# Patient Record
Sex: Male | Born: 1945 | Race: Black or African American | Hispanic: No | Marital: Single | Smoking: Former smoker
Health system: Southern US, Community
[De-identification: ages and names within clinical notes are randomized; demographics above are authoritative.]

## PROBLEM LIST (undated history)

## (undated) DIAGNOSIS — F0390 Unspecified dementia without behavioral disturbance: Secondary | ICD-10-CM

## (undated) DIAGNOSIS — I10 Essential (primary) hypertension: Secondary | ICD-10-CM

## (undated) DIAGNOSIS — E875 Hyperkalemia: Secondary | ICD-10-CM

---

## 2001-10-19 ENCOUNTER — Inpatient Hospital Stay (HOSPITAL_COMMUNITY): Admission: EM | Admit: 2001-10-19 | Discharge: 2001-10-22 | Payer: Self-pay

## 2001-10-19 ENCOUNTER — Encounter: Payer: Self-pay | Admitting: General Surgery

## 2001-10-20 ENCOUNTER — Encounter: Payer: Self-pay | Admitting: General Surgery

## 2005-12-04 ENCOUNTER — Emergency Department (HOSPITAL_COMMUNITY): Admission: EM | Admit: 2005-12-04 | Discharge: 2005-12-04 | Payer: Self-pay | Admitting: Emergency Medicine

## 2007-05-09 ENCOUNTER — Emergency Department (HOSPITAL_COMMUNITY): Admission: EM | Admit: 2007-05-09 | Discharge: 2007-05-09 | Payer: Self-pay | Admitting: Emergency Medicine

## 2007-08-17 ENCOUNTER — Emergency Department (HOSPITAL_COMMUNITY): Admission: EM | Admit: 2007-08-17 | Discharge: 2007-08-17 | Payer: Self-pay | Admitting: Emergency Medicine

## 2007-08-22 ENCOUNTER — Emergency Department (HOSPITAL_COMMUNITY): Admission: EM | Admit: 2007-08-22 | Discharge: 2007-08-22 | Payer: Self-pay | Admitting: Emergency Medicine

## 2008-05-13 ENCOUNTER — Emergency Department (HOSPITAL_BASED_OUTPATIENT_CLINIC_OR_DEPARTMENT_OTHER): Admission: EM | Admit: 2008-05-13 | Discharge: 2008-05-13 | Payer: Self-pay | Admitting: Emergency Medicine

## 2008-05-22 ENCOUNTER — Emergency Department (HOSPITAL_COMMUNITY): Admission: EM | Admit: 2008-05-22 | Discharge: 2008-05-23 | Payer: Self-pay | Admitting: Emergency Medicine

## 2008-06-08 ENCOUNTER — Emergency Department (HOSPITAL_COMMUNITY): Admission: EM | Admit: 2008-06-08 | Discharge: 2008-06-08 | Payer: Self-pay | Admitting: Emergency Medicine

## 2008-06-10 ENCOUNTER — Emergency Department (HOSPITAL_COMMUNITY): Admission: EM | Admit: 2008-06-10 | Discharge: 2008-06-10 | Payer: Self-pay | Admitting: Emergency Medicine

## 2008-12-08 ENCOUNTER — Ambulatory Visit: Payer: Self-pay | Admitting: Cardiology

## 2008-12-09 ENCOUNTER — Inpatient Hospital Stay (HOSPITAL_COMMUNITY): Admission: EM | Admit: 2008-12-09 | Discharge: 2008-12-10 | Payer: Self-pay | Admitting: Emergency Medicine

## 2008-12-10 ENCOUNTER — Encounter (INDEPENDENT_AMBULATORY_CARE_PROVIDER_SITE_OTHER): Payer: Self-pay | Admitting: Internal Medicine

## 2009-01-28 ENCOUNTER — Emergency Department (HOSPITAL_COMMUNITY): Admission: EM | Admit: 2009-01-28 | Discharge: 2009-01-28 | Payer: Self-pay | Admitting: Emergency Medicine

## 2009-01-30 ENCOUNTER — Emergency Department (HOSPITAL_COMMUNITY): Admission: EM | Admit: 2009-01-30 | Discharge: 2009-01-30 | Payer: Self-pay | Admitting: Emergency Medicine

## 2009-04-15 ENCOUNTER — Ambulatory Visit: Payer: Self-pay | Admitting: Cardiovascular Disease

## 2009-04-15 ENCOUNTER — Inpatient Hospital Stay (HOSPITAL_COMMUNITY): Admission: EM | Admit: 2009-04-15 | Discharge: 2009-04-30 | Payer: Self-pay | Admitting: Emergency Medicine

## 2009-04-16 ENCOUNTER — Encounter (INDEPENDENT_AMBULATORY_CARE_PROVIDER_SITE_OTHER): Payer: Self-pay | Admitting: Internal Medicine

## 2009-04-16 ENCOUNTER — Ambulatory Visit: Payer: Self-pay | Admitting: Surgery

## 2009-04-17 ENCOUNTER — Encounter (INDEPENDENT_AMBULATORY_CARE_PROVIDER_SITE_OTHER): Payer: Self-pay | Admitting: Internal Medicine

## 2010-03-21 LAB — POCT I-STAT, CHEM 8
BUN: 21 mg/dL (ref 6–23)
Calcium, Ion: 1.15 mmol/L (ref 1.12–1.32)
Creatinine, Ser: 1.2 mg/dL (ref 0.4–1.5)
Potassium: 3.9 mEq/L (ref 3.5–5.1)

## 2010-03-21 LAB — URINALYSIS, ROUTINE W REFLEX MICROSCOPIC
Hgb urine dipstick: NEGATIVE
Nitrite: NEGATIVE
Protein, ur: NEGATIVE mg/dL
Specific Gravity, Urine: 1.014 (ref 1.005–1.030)
pH: 6 (ref 5.0–8.0)

## 2010-03-23 LAB — GLUCOSE, CAPILLARY
Glucose-Capillary: 109 mg/dL — ABNORMAL HIGH (ref 70–99)
Glucose-Capillary: 114 mg/dL — ABNORMAL HIGH (ref 70–99)
Glucose-Capillary: 71 mg/dL (ref 70–99)

## 2010-03-23 LAB — BASIC METABOLIC PANEL
BUN: 14 mg/dL (ref 6–23)
BUN: 28 mg/dL — ABNORMAL HIGH (ref 6–23)
BUN: 32 mg/dL — ABNORMAL HIGH (ref 6–23)
CO2: 24 mEq/L (ref 19–32)
CO2: 25 mEq/L (ref 19–32)
CO2: 27 mEq/L (ref 19–32)
CO2: 28 mEq/L (ref 19–32)
CO2: 29 mEq/L (ref 19–32)
Calcium: 8.5 mg/dL (ref 8.4–10.5)
Calcium: 9.1 mg/dL (ref 8.4–10.5)
Calcium: 9.2 mg/dL (ref 8.4–10.5)
Calcium: 9.5 mg/dL (ref 8.4–10.5)
Calcium: 9.5 mg/dL (ref 8.4–10.5)
Chloride: 102 mEq/L (ref 96–112)
Chloride: 103 mEq/L (ref 96–112)
Chloride: 103 mEq/L (ref 96–112)
Chloride: 103 mEq/L (ref 96–112)
Chloride: 109 mEq/L (ref 96–112)
Creatinine, Ser: 1 mg/dL (ref 0.4–1.5)
Creatinine, Ser: 1 mg/dL (ref 0.4–1.5)
Creatinine, Ser: 1.19 mg/dL (ref 0.4–1.5)
Creatinine, Ser: 1.23 mg/dL (ref 0.4–1.5)
GFR calc Af Amer: >60 mL/min (ref >60)
GFR calc Af Amer: >60 mL/min (ref >60)
GFR calc Af Amer: >60 mL/min (ref >60)
GFR calc Af Amer: >60 mL/min (ref >60)
GFR calc Af Amer: >60 mL/min (ref >60)
GFR calc non Af Amer: >60 mL/min (ref >60)
GFR calc non Af Amer: >60 mL/min (ref >60)
Glucose, Bld: 66 mg/dL — ABNORMAL LOW (ref 70–99)
Glucose, Bld: 74 mg/dL (ref 70–99)
Glucose, Bld: 79 mg/dL (ref 70–99)
Glucose, Bld: 83 mg/dL (ref 70–99)
Potassium: 5.2 mEq/L — ABNORMAL HIGH (ref 3.5–5.1)
Potassium: 5.3 mEq/L — ABNORMAL HIGH (ref 3.5–5.1)
Potassium: 5.6 mEq/L — ABNORMAL HIGH (ref 3.5–5.1)
Sodium: 135 mEq/L (ref 135–145)
Sodium: 136 mEq/L (ref 135–145)
Sodium: 138 mEq/L (ref 135–145)

## 2010-03-23 LAB — CBC
HCT: 45.7 % (ref 39.0–52.0)
Hemoglobin: 15.3 g/dL (ref 13.0–17.0)
MCV: 86.2 fL (ref 78.0–100.0)
Platelets: 282 10*3/uL (ref 150–400)
Platelets: 295 10*3/uL (ref 150–400)
RBC: 4.1 MIL/uL — ABNORMAL LOW (ref 4.22–5.81)
RDW: 14.4 % (ref 11.5–15.5)
WBC: 6 10*3/uL (ref 4.0–10.5)
WBC: 7.2 10*3/uL (ref 4.0–10.5)

## 2010-03-23 LAB — PSA: PSA: 0.68 ng/mL (ref 0.10–4.00)

## 2010-03-23 LAB — VITAMIN B1: Vitamin B1 (Thiamine): 10 nmol/L (ref 9–44)

## 2010-03-23 LAB — VITAMIN B12: Vitamin B-12: 265 pg/mL (ref 211–911)

## 2010-03-24 LAB — AMMONIA: Ammonia: 43 umol/L — ABNORMAL HIGH (ref 11–35)

## 2010-03-24 LAB — RETICULOCYTES
RBC.: 4.72 MIL/uL (ref 4.22–5.81)
Retic Ct Pct: 0.6 % (ref 0.4–3.1)

## 2010-03-24 LAB — DIFFERENTIAL
Basophils Absolute: 0 10*3/uL (ref 0.0–0.1)
Basophils Absolute: 0.1 10*3/uL (ref 0.0–0.1)
Basophils Relative: 0 % (ref 0–1)
Eosinophils Relative: 2 % (ref 0–5)
Eosinophils Relative: 4 % (ref 0–5)
Lymphocytes Relative: 14 % (ref 12–46)
Lymphocytes Relative: 18 % (ref 12–46)
Lymphs Abs: 1.1 10*3/uL (ref 0.7–4.0)
Monocytes Absolute: 0.6 10*3/uL (ref 0.1–1.0)
Neutro Abs: 4.2 10*3/uL (ref 1.7–7.7)

## 2010-03-24 LAB — IRON AND TIBC
Iron: 77 ug/dL (ref 42–135)
Saturation Ratios: 32 % (ref 20–55)
UIBC: 164 ug/dL

## 2010-03-24 LAB — CBC
HCT: 36.5 % — ABNORMAL LOW (ref 39.0–52.0)
HCT: 39.5 % (ref 39.0–52.0)
MCHC: 33.2 g/dL (ref 30.0–36.0)
MCV: 84.9 fL (ref 78.0–100.0)
MCV: 86.2 fL (ref 78.0–100.0)
Platelets: 300 10*3/uL (ref 150–400)
RBC: 4.29 MIL/uL (ref 4.22–5.81)
WBC: 7.8 10*3/uL (ref 4.0–10.5)

## 2010-03-24 LAB — COMPREHENSIVE METABOLIC PANEL
AST: 28 U/L (ref 0–37)
AST: 28 U/L (ref 0–37)
Albumin: 3.5 g/dL (ref 3.5–5.2)
BUN: 18 mg/dL (ref 6–23)
BUN: 25 mg/dL — ABNORMAL HIGH (ref 6–23)
CO2: 25 mEq/L (ref 19–32)
Calcium: 8.5 mg/dL (ref 8.4–10.5)
Chloride: 105 mEq/L (ref 96–112)
Creatinine, Ser: 1.18 mg/dL (ref 0.4–1.5)
Creatinine, Ser: 1.39 mg/dL (ref 0.4–1.5)
GFR calc Af Amer: >60 mL/min (ref >60)
GFR calc Af Amer: >60 mL/min (ref >60)
GFR calc non Af Amer: 52 mL/min — ABNORMAL LOW (ref >60)
GFR calc non Af Amer: >60 mL/min (ref >60)
Total Bilirubin: 0.4 mg/dL (ref 0.3–1.2)

## 2010-03-24 LAB — RAPID URINE DRUG SCREEN, HOSP PERFORMED
Amphetamines: NOT DETECTED
Tetrahydrocannabinol: NOT DETECTED

## 2010-03-24 LAB — RPR: RPR Ser Ql: NONREACTIVE

## 2010-03-24 LAB — URINALYSIS, ROUTINE W REFLEX MICROSCOPIC
Hgb urine dipstick: NEGATIVE
Ketones, ur: 15 mg/dL — AB
Protein, ur: NEGATIVE mg/dL
Urobilinogen, UA: 1 mg/dL (ref 0.0–1.0)

## 2010-03-24 LAB — PROTIME-INR: Prothrombin Time: 15.7 seconds — ABNORMAL HIGH (ref 11.6–15.2)

## 2010-03-24 LAB — APTT: aPTT: 35 seconds (ref 24–37)

## 2010-03-24 LAB — FERRITIN: Ferritin: 147 ng/mL (ref 22–322)

## 2010-03-24 LAB — TSH: TSH: 1.213 u[IU]/mL (ref 0.350–4.500)

## 2010-03-24 LAB — CK TOTAL AND CKMB (NOT AT ARMC)
CK, MB: 5.4 ng/mL — ABNORMAL HIGH (ref 0.3–4.0)
Total CK: 465 U/L — ABNORMAL HIGH (ref 7–232)

## 2010-03-24 LAB — LIPID PANEL
HDL: 43 mg/dL (ref >39)
Triglycerides: 23 mg/dL (ref <150)
VLDL: 5 mg/dL (ref 0–40)

## 2010-03-24 LAB — PHOSPHORUS: Phosphorus: 3.2 mg/dL (ref 2.3–4.6)

## 2010-03-24 LAB — HEMOGLOBIN A1C: Mean Plasma Glucose: 123 mg/dL — ABNORMAL HIGH (ref <117)

## 2010-03-24 LAB — TROPONIN I: Troponin I: <0.01 ng/mL (ref 0.00–0.06)

## 2010-04-06 LAB — DIFFERENTIAL
Lymphocytes Relative: 16 % (ref 12–46)
Lymphs Abs: 0.8 10*3/uL (ref 0.7–4.0)
Monocytes Relative: 6 % (ref 3–12)
Neutro Abs: 4 10*3/uL (ref 1.7–7.7)
Neutrophils Relative %: 77 % (ref 43–77)

## 2010-04-06 LAB — CARDIAC PANEL(CRET KIN+CKTOT+MB+TROPI)
CK, MB: 10.9 ng/mL — ABNORMAL HIGH (ref 0.3–4.0)
CK, MB: 11.5 ng/mL — ABNORMAL HIGH (ref 0.3–4.0)
CK, MB: 5.6 ng/mL — ABNORMAL HIGH (ref 0.3–4.0)
CK, MB: 7.9 ng/mL — ABNORMAL HIGH (ref 0.3–4.0)
CK, MB: 8.5 ng/mL — ABNORMAL HIGH (ref 0.3–4.0)
Relative Index: 2.6 — ABNORMAL HIGH (ref 0.0–2.5)
Relative Index: 3.6 — ABNORMAL HIGH (ref 0.0–2.5)
Total CK: 213 U/L (ref 7–232)
Total CK: 217 U/L (ref 7–232)
Total CK: 263 U/L — ABNORMAL HIGH (ref 7–232)
Total CK: 298 U/L — ABNORMAL HIGH (ref 7–232)
Total CK: 371 U/L — ABNORMAL HIGH (ref 7–232)
Troponin I: 0.01 ng/mL (ref 0.00–0.06)
Troponin I: 0.01 ng/mL (ref 0.00–0.06)

## 2010-04-06 LAB — COMPREHENSIVE METABOLIC PANEL
ALT: 13 U/L (ref 0–53)
BUN: 16 mg/dL (ref 6–23)
Calcium: 9 mg/dL (ref 8.4–10.5)
Creatinine, Ser: 0.87 mg/dL (ref 0.4–1.5)
Glucose, Bld: 91 mg/dL (ref 70–99)
Sodium: 136 mEq/L (ref 135–145)
Total Protein: 6.9 g/dL (ref 6.0–8.3)

## 2010-04-06 LAB — BASIC METABOLIC PANEL
CO2: 27 mEq/L (ref 19–32)
Calcium: 9.1 mg/dL (ref 8.4–10.5)
Chloride: 102 mEq/L (ref 96–112)
GFR calc Af Amer: 59 mL/min — ABNORMAL LOW (ref >60)
Glucose, Bld: 88 mg/dL (ref 70–99)
Sodium: 138 mEq/L (ref 135–145)

## 2010-04-06 LAB — URINALYSIS, ROUTINE W REFLEX MICROSCOPIC
Bilirubin Urine: NEGATIVE
Glucose, UA: NEGATIVE mg/dL
Glucose, UA: NEGATIVE mg/dL
Ketones, ur: NEGATIVE mg/dL
Specific Gravity, Urine: 1.021 (ref 1.005–1.030)
Specific Gravity, Urine: 1.022 (ref 1.005–1.030)
Urobilinogen, UA: 1 mg/dL (ref 0.0–1.0)
pH: 6 (ref 5.0–8.0)

## 2010-04-06 LAB — CBC
Hemoglobin: 13.1 g/dL (ref 13.0–17.0)
Hemoglobin: 13.5 g/dL (ref 13.0–17.0)
MCHC: 32.2 g/dL (ref 30.0–36.0)
MCHC: 32.8 g/dL (ref 30.0–36.0)
RBC: 4.74 MIL/uL (ref 4.22–5.81)
RDW: 14.7 % (ref 11.5–15.5)
WBC: 5.2 10*3/uL (ref 4.0–10.5)

## 2010-04-06 LAB — POCT CARDIAC MARKERS
CKMB, poc: 2.4 ng/mL (ref 1.0–8.0)
Myoglobin, poc: 172 ng/mL (ref 12–200)
Troponin i, poc: <0.05 ng/mL (ref 0.00–0.09)

## 2010-04-06 LAB — URINE MICROSCOPIC-ADD ON

## 2010-04-06 LAB — RAPID URINE DRUG SCREEN, HOSP PERFORMED: Barbiturates: NOT DETECTED

## 2010-05-18 NOTE — Consult Note (Signed)
Gentry, Leonard                 ACCOUNT NO.:  [id number]   MEDICAL RECORD NO.:  [id number]          PATIENT TYPE:  EMS   LOCATION:  ED                           FACILITY:  [facility]   PHYSICIAN:  Velora Heckler, MD      DATE OF BIRTH:  1945/02/05   DATE OF CONSULTATION:  08/17/2007  DATE OF DISCHARGE:                                 CONSULTATION   REPORT TITLE:  EMERGENCY ROOM CONSULTATION   REFERRING PHYSICIAN:  Dr. Benjiman Core   CHIEF COMPLAINT:  Abdominal pain.   HISTORY OF PRESENT ILLNESS:  Leonard Gentry is a 65 year old black male who  presented to the emergency department this morning with complaints of  right lower quadrant abdominal pain.  The patient has a known history of  a large right hydrocele.  Workup included CBC with a normal white blood  cell count and normal differential.  CT scan abdomen and pelvis was  obtained which had soft findings of a possible distal small bowel  intussusception.  Also noted was a large right groin and scrotal  hydrocele.  General surgery was asked to evaluate.   The patient noted onset of lower abdominal pain approximately mid  morning.  The patient is now pain free.  There is no nausea or vomiting.  The patient had a normal bowel movement yesterday.   PAST MEDICAL HISTORY:  Hypertension, untreated.   MEDICATIONS:  None.   ALLERGIES:  None known.   SOCIAL HISTORY:  The patient is homeless.  He denies tobacco or alcohol  use.   REVIEW OF SYSTEMS:  Fifteen system review otherwise negative.  The  patient does note hypertension, but states that it comes and goes.   PHYSICAL EXAMINATION:  GENERAL:  A 65 year old black male in no acute  distress on a stretcher in the emergency department.  VITAL SIGNS:  Temperature 98.4, pulse 80, respirations 16, blood pressure 158/111.  HEENT:  Shows him to be normocephalic, atraumatic.  Mucous membranes are  moist.  Sclerae are without signs of icterus.  CHEST:  Clear to auscultation  bilaterally.  CARDIAC:  Shows a regular rate and rhythm.  Peripheral pulses are full.  ABDOMEN:  Soft without distention.  There are active bowel sounds  present.  There is no tenderness.  There is no mass.  There is no  guarding.  There is no rebound tenderness.  There are no obvious  surgical wounds.  GENITOURINARY:  Shows a very large right scrotal hydrocele.  This  extends from the inguinal canal to the base of the scrotum.  It  essentially stretches the foreskin of the penis over the penis.  With  Valsalva and cough, I do not appreciate a hernia in either the right or  left groin.  EXTREMITIES:  Nontender without edema.  NEUROLOGICAL:  The patient is alert and oriented and appropriately  responsive.  There is no sign of tremor.   LABORATORY STUDIES:  White count 7.3, hemoglobin 14.0, platelet count  234,000.  Electrolytes are normal.   CT scan abdomen and pelvis was reviewed with my partner, Dr. Molli Hazard  Tsuei.  While there are some subtle findings in the right lower quadrant  that could possibly represent a small bowel intussusception, it is  certainly not definitive.  It is not obstructive.  There is also noted  the right hydrocele extending into the right inguinal canal.   IMPRESSION:  1. Possible intermittent small bowel intussusception, now resolved.  2. Large right hydrocele.   RECOMMENDATIONS:  1. Referral to [facility] to coordinate patient's care.  2. Consider small bowel follow-through series to evaluate small bowel      for source of intussusception, such as a lipoma or other mass that      could serve as a leading edge  3. Urology referral for hydrocele repair.      Velora Heckler, MD  Electronically Signed     TMG/MEDQ  D:  08/17/2007  T:  08/17/2007  Job:  [id number]   cc:   Billee Cashing, MD   Dineen Kid Reche Dixon, M.D.  Fax: [phone]
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
## 2010-10-01 LAB — DIFFERENTIAL
Basophils Relative: 0
Lymphocytes Relative: 8 — ABNORMAL LOW
Lymphs Abs: 0.6 — ABNORMAL LOW
Monocytes Absolute: 0.2
Monocytes Relative: 3
Neutro Abs: 6.5
Neutrophils Relative %: 89 — ABNORMAL HIGH

## 2010-10-01 LAB — POCT I-STAT, CHEM 8
BUN: 15
Chloride: 103
Glucose, Bld: 87
HCT: 46
Potassium: 4.5

## 2010-10-01 LAB — CBC
HCT: 43.5
Hemoglobin: 14
MCHC: 32.1
MCV: 86.9
RBC: 5.01
WBC: 7.3

## 2011-04-11 ENCOUNTER — Other Ambulatory Visit: Payer: Self-pay | Admitting: Internal Medicine

## 2011-11-11 ENCOUNTER — Emergency Department (HOSPITAL_COMMUNITY): Payer: Medicare Other

## 2011-11-11 ENCOUNTER — Encounter (HOSPITAL_COMMUNITY): Payer: Self-pay | Admitting: Emergency Medicine

## 2011-11-11 ENCOUNTER — Emergency Department (HOSPITAL_COMMUNITY)
Admission: EM | Admit: 2011-11-11 | Discharge: 2011-11-11 | Disposition: A | Discharge status: Home / Self Care | Payer: Medicare Other | Attending: Emergency Medicine | Admitting: Emergency Medicine

## 2011-11-11 DIAGNOSIS — Z79899 Other long term (current) drug therapy: Secondary | ICD-10-CM | POA: Insufficient documentation

## 2011-11-11 DIAGNOSIS — I10 Essential (primary) hypertension: Secondary | ICD-10-CM | POA: Insufficient documentation

## 2011-11-11 DIAGNOSIS — Y939 Activity, unspecified: Secondary | ICD-10-CM | POA: Insufficient documentation

## 2011-11-11 DIAGNOSIS — Y9210 Unspecified residential institution as the place of occurrence of the external cause: Secondary | ICD-10-CM | POA: Insufficient documentation

## 2011-11-11 DIAGNOSIS — S0990XA Unspecified injury of head, initial encounter: Secondary | ICD-10-CM | POA: Insufficient documentation

## 2011-11-11 DIAGNOSIS — W19XXXA Unspecified fall, initial encounter: Secondary | ICD-10-CM

## 2011-11-11 DIAGNOSIS — F0390 Unspecified dementia without behavioral disturbance: Secondary | ICD-10-CM | POA: Insufficient documentation

## 2011-11-11 DIAGNOSIS — W06XXXA Fall from bed, initial encounter: Secondary | ICD-10-CM | POA: Insufficient documentation

## 2011-11-11 DIAGNOSIS — Z7982 Long term (current) use of aspirin: Secondary | ICD-10-CM | POA: Insufficient documentation

## 2011-11-11 HISTORY — DX: Essential (primary) hypertension: I10

## 2011-11-11 HISTORY — DX: Unspecified dementia, unspecified severity, without behavioral disturbance, psychotic disturbance, mood disturbance, and anxiety: F03.90

## 2011-11-11 HISTORY — DX: Hyperkalemia: E87.5

## 2011-11-11 NOTE — ED Notes (Signed)
ZOX:[id number]<AV> Expected date:11/11/11<BR> Expected time: 1:18 AM<BR> Means of arrival:Ambulance<BR> Comments:<BR> fall

## 2011-11-11 NOTE — ED Notes (Signed)
Report given via EMS. Pt c/o lower back pain as a result of unwitnessed fall 0100. No deformity in spine or back of neck noted via EMS. Pt from [facility] on [address]. Pt was found with legs elevated on bed and head on floor. EMS found pt placed back in bed asleep on arrival. Pt coherent upon awakening, denies remember falling "just woke up on the floor." Initial VS 128/82 Pulse 96 RR 12, no SOB pt not placed on monitor at 0115.

## 2011-11-11 NOTE — ED Notes (Signed)
Pt taken to CT.

## 2011-11-11 NOTE — ED Provider Notes (Signed)
History     CSN: [id number]  Arrival date & time 11/11/11  0129   First MD Initiated Contact with Patient 11/11/11 0147      Chief Complaint  Patient presents with  . Fall    (Consider location/radiation/quality/duration/timing/severity/associated sxs/prior treatment) HPI HX per EMS per nursing home, fell tonight at nursing facility, found legs on bed and head on floor, PT does not recall falling , has h/o dementia and reported at baseline. No bleeding or other inury noted, Pt denies any pain, is questionable reliable historian - level 5 caveat applies Past Medical History  Diagnosis Date  . Hypertension   . Hyperkalemia   . Dementia     History reviewed. No pertinent past surgical history.  No family history on file.  History  Substance Use Topics  . Smoking status: Not on file  . Smokeless tobacco: Not on file  . Alcohol Use:       Review of Systems  Unable to perform ROS level 5 caveat as above - unreliable historian with dementia  Allergies  Review of patient's allergies indicates no known allergies.  Home Medications  No current outpatient prescriptions on file.  BP 107/63  Pulse 97  Temp 97.3 F (36.3 C) (Oral)  Resp 16  SpO2 96%  Physical Exam  Constitutional: He appears well-developed and well-nourished.  HENT:  Head: Normocephalic and atraumatic.  Eyes: Conjunctivae normal and EOM are normal. Pupils are equal, round, and reactive to light.  Neck:       No midline deformity or reproducible tenderness  Cardiovascular: Normal rate, regular rhythm and intact distal pulses.   Pulmonary/Chest: Effort normal and breath sounds normal. No respiratory distress.  Musculoskeletal: Normal range of motion. He exhibits no edema.       Mild lumbar tenderness no deformity. No LE deficits  Neurological:       Awake, alert and oriented to self  Skin: Skin is warm and dry.    ED Course  Procedures (including critical care time)   Dg Lumbar Spine  Complete  11/11/2011  *RADIOLOGY REPORT*  Clinical Data: Status post fall; lower back pain.  LUMBAR SPINE - COMPLETE 4+ VIEW  Comparison: CT of the abdomen and pelvis performed 08/17/2007  Findings: There is no evidence of acute fracture or subluxation. There is slight grade 1 anterolisthesis of L4 on L5.  Facet disease is noted along the lumbar spine; mild degenerative change is noted at the lower lumbar spine.  Slight apparent wedging of vertebral body T11 appears to reflect positioning.  The visualized bowel gas pattern is unremarkable in appearance; air and stool are noted within the colon.  The sacroiliac joints are within normal limits.  IMPRESSION:  1.  No evidence of acute fracture or subluxation along the lumbar spine. 2.  Mild degenerative change along the lumbar spine.   Original Report Authenticated By: Tonia Ghent, M.D.    Ct Head Wo Contrast  11/11/2011  *RADIOLOGY REPORT*  Clinical Data:  Unwitnessed fall; found with head on floor. Concern for head or cervical spine injury.  CT HEAD WITHOUT CONTRAST AND CT CERVICAL SPINE WITHOUT CONTRAST  Technique:  Multidetector CT imaging of the head and cervical spine was performed following the standard protocol without intravenous contrast.  Multiplanar CT image reconstructions of the cervical spine were also generated.  Comparison: CT of the head performed 04/15/2009, and MRI/MRA of the brain performed 04/16/2009  CT HEAD  Findings: There is no evidence of acute infarction, mass lesion, or intra-  or extra-axial hemorrhage on CT.  Prominence of the ventricles and sulci reflects moderate cortical volume loss.  Periventricular and subcortical white matter change likely reflects small vessel ischemic microangiopathy.  Mild cerebellar atrophy is noted.  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  The brainstem and fourth ventricle are within normal limits.  The basal ganglia are unremarkable in appearance.  No mass effect or midline  shift is seen.  There is no evidence of fracture; visualized osseous structures are unremarkable in appearance.  The orbits are within normal limits. The paranasal sinuses and mastoid air cells are well-aerated.  A soft tissue laceration is noted overlying the right frontal calvarium.  A large amount of cerumen is noted within the right external auditory canal.  IMPRESSION:  1.  No evidence of traumatic intracranial injury or fracture. 2.  Moderate cortical volume loss and scattered small vessel ischemic microangiopathy; small chronic infarct noted within the inferior left frontal lobe. 3.  Soft tissue laceration overlying the right frontal calvarium. 4.  Large amount of cerumen noted within the right external auditory canal.  CT CERVICAL SPINE  Findings: There is no evidence of fracture or subluxation. Vertebral bodies demonstrate normal height and alignment. Multilevel disc space narrowing is noted along the cervical spine, with associated anterior and posterior disc osteophyte complexes. Prevertebral soft tissues are within normal limits.  The thyroid gland is unremarkable in appearance.  The visualized lung apices are clear.  No significant soft tissue abnormalities are seen.  IMPRESSION:  1.  No evidence of fracture or subluxation along the cervical spine. 2.  Degenerative change noted along the cervical spine.   Original Report Authenticated By: Tonia Ghent, M.D.    Ct Cervical Spine Wo Contrast  11/11/2011  *RADIOLOGY REPORT*  Clinical Data:  Unwitnessed fall; found with head on floor. Concern for head or cervical spine injury.  CT HEAD WITHOUT CONTRAST AND CT CERVICAL SPINE WITHOUT CONTRAST  Technique:  Multidetector CT imaging of the head and cervical spine was performed following the standard protocol without intravenous contrast.  Multiplanar CT image reconstructions of the cervical spine were also generated.  Comparison: CT of the head performed 04/15/2009, and MRI/MRA of the brain performed  04/16/2009  CT HEAD  Findings: There is no evidence of acute infarction, mass lesion, or intra- or extra-axial hemorrhage on CT.  Prominence of the ventricles and sulci reflects moderate cortical volume loss.  Periventricular and subcortical white matter change likely reflects small vessel ischemic microangiopathy.  Mild cerebellar atrophy is noted.  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  The brainstem and fourth ventricle are within normal limits.  The basal ganglia are unremarkable in appearance.  No mass effect or midline shift is seen.  There is no evidence of fracture; visualized osseous structures are unremarkable in appearance.  The orbits are within normal limits. The paranasal sinuses and mastoid air cells are well-aerated.  A soft tissue laceration is noted overlying the right frontal calvarium.  A large amount of cerumen is noted within the right external auditory canal.  IMPRESSION:  1.  No evidence of traumatic intracranial injury or fracture. 2.  Moderate cortical volume loss and scattered small vessel ischemic microangiopathy; small chronic infarct noted within the inferior left frontal lobe. 3.  Soft tissue laceration overlying the right frontal calvarium. 4.  Large amount of cerumen noted within the right external auditory canal.  CT CERVICAL SPINE  Findings: There is no evidence  of fracture or subluxation. Vertebral bodies demonstrate normal height and alignment. Multilevel disc space narrowing is noted along the cervical spine, with associated anterior and posterior disc osteophyte complexes. Prevertebral soft tissues are within normal limits.  The thyroid gland is unremarkable in appearance.  The visualized lung apices are clear.  No significant soft tissue abnormalities are seen.  IMPRESSION:  1.  No evidence of fracture or subluxation along the cervical spine. 2.  Degenerative change noted along the cervical spine.   Original Report Authenticated By: Tonia Ghent,  M.D.     MDM   Fall in nursing home PT with dementia - fell partially out of bed, evaluated with imaging reviewed as above, no evidence fo acute injury, no indication for admit or further work up at this time, reported baseline mentation, stable for d/c back to facility        Sunnie Nielsen, MD 11/11/11 [phone]

## 2011-11-11 NOTE — ED Notes (Signed)
Pt in bed resting with eyes closed and respirations normal on assessment during entire stay.

## 2012-04-06 ENCOUNTER — Encounter: Payer: Self-pay | Admitting: Nurse Practitioner

## 2012-04-06 ENCOUNTER — Non-Acute Institutional Stay (SKILLED_NURSING_FACILITY): Payer: Medicare Other | Admitting: Nurse Practitioner

## 2012-04-06 DIAGNOSIS — H409 Unspecified glaucoma: Secondary | ICD-10-CM

## 2012-04-06 DIAGNOSIS — F1020 Alcohol dependence, uncomplicated: Secondary | ICD-10-CM

## 2012-04-06 DIAGNOSIS — I10 Essential (primary) hypertension: Secondary | ICD-10-CM

## 2012-04-06 DIAGNOSIS — F0390 Unspecified dementia without behavioral disturbance: Secondary | ICD-10-CM

## 2012-04-23 ENCOUNTER — Encounter: Payer: Self-pay | Admitting: Nurse Practitioner

## 2012-04-23 DIAGNOSIS — H409 Unspecified glaucoma: Secondary | ICD-10-CM | POA: Insufficient documentation

## 2012-04-23 DIAGNOSIS — F0390 Unspecified dementia without behavioral disturbance: Secondary | ICD-10-CM | POA: Insufficient documentation

## 2012-04-23 DIAGNOSIS — F1020 Alcohol dependence, uncomplicated: Secondary | ICD-10-CM | POA: Insufficient documentation

## 2012-04-23 DIAGNOSIS — I10 Essential (primary) hypertension: Secondary | ICD-10-CM | POA: Insufficient documentation

## 2012-04-23 NOTE — Progress Notes (Signed)
Patient ID: Leonard Gentry, male   DOB: 07/31/1945, 67 y.o.   MRN: [id number]  Chief Complaint: medical management of chronic conditions  HPI:  67 year old male seen today for routine visit. Pt currently without any complaints and staff without any concerns at this time.   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 No complications noted from the medication presently being  used.takes lisinopril 20 mg daily asa 81 mg daily   Review of Systems:  Review of Systems  Constitutional: Negative for fever, chills and malaise/fatigue.  HENT: Negative for congestion and sore throat.   Respiratory: Negative for cough and shortness of breath.   Cardiovascular: Negative for chest pain, palpitations and leg swelling.  Gastrointestinal: Negative for abdominal pain, diarrhea and constipation.  Genitourinary: Negative for dysuria.  Skin: Negative.      Medications: Patient's Medications  New Prescriptions   No medications on file  Previous Medications   ACETAMINOPHEN (TYLENOL) 325 MG TABLET    Take 650 mg by mouth every 6 (six) hours as needed. For pain   ASPIRIN EC 81 MG TABLET    Take 81 mg by mouth daily.   BIMATOPROST (LUMIGAN) 0.01 % SOLN    Place 1 drop into both eyes at bedtime.   DONEPEZIL (ARICEPT) 10 MG TABLET    Take 10 mg by mouth at bedtime.   FOLIC ACID (FOLVITE) 1 MG TABLET    Take 1 mg by mouth daily.   LISINOPRIL  (PRINIVIL,ZESTRIL) 20 MG TABLET    Take 20 mg by mouth daily.   MEMANTINE HCL ER (NAMENDA XR) 28 MG CP24    Take 1 capsule by mouth daily.   MIRTAZAPINE (REMERON) 15 MG TABLET    Take 7.5 mg by mouth at bedtime.   MULTIPLE VITAMIN (MULTIVITAMIN WITH MINERALS) TABS    Take 1 tablet by mouth daily.   OVER THE COUNTER MEDICATION    Take 240 mLs by mouth 3 (three) times daily. 2 calorie supplement   THIAMINE (VITAMIN B-1) 100 MG TABLET    Take 100 mg by mouth daily.   VITAMIN B-12 (CYANOCOBALAMIN) 1000 MCG TABLET    Take 1,000 mcg by mouth daily.  Modified Medications   No medications on file  Discontinued Medications   No medications on file     Physical Exam: Physical Exam  Constitutional: He appears well-developed and well-nourished.  HENT:  Head: Normocephalic and atraumatic.  Right Ear: External ear normal.  Left Ear: External ear normal.  Nose: Nose normal.  Mouth/Throat: Oropharynx is clear and moist.  Eyes: EOM are normal. Pupils are equal, round, and reactive to light.  Neck: Normal range of motion. Neck supple.  Cardiovascular: Normal rate, regular rhythm and normal heart sounds.   Pulmonary/Chest: Effort normal and breath sounds normal.  Abdominal: Soft. Bowel sounds are normal.  Musculoskeletal:  Currently in wheelchair  Neurological: He is alert.  Skin: Skin is warm and dry.  Psychiatric: He has  a normal mood and affect.    Filed Vitals:   04/06/12 1501  BP: 123/79  Pulse: 89  Resp: 18  Height: 6' (1.829 m)  Weight: 155 lb (70.308 kg)  SpO2: 97%      Assessment/Plan Essential hypertension, benign Stable- will cont current medications- will get cmp  Dementia Patient is stable; continue current regimen. Will monitor and make changes as necessary.   Unspecified glaucoma(365.9) Will cont current medications   Other and unspecified alcohol dependence, unspecified drinking behavior History of- on vitamins currently- will follow up cbc

## 2012-04-23 NOTE — Assessment & Plan Note (Signed)
Patient is stable; continue current regimen. Will monitor and make changes as necessary.  

## 2012-04-23 NOTE — Assessment & Plan Note (Signed)
History of- on vitamins currently- will follow up cbc

## 2012-04-23 NOTE — Assessment & Plan Note (Signed)
Stable- will cont current medications- will get cmp

## 2012-04-23 NOTE — Assessment & Plan Note (Signed)
Will cont current medications

## 2012-06-05 ENCOUNTER — Non-Acute Institutional Stay (SKILLED_NURSING_FACILITY): Payer: Medicare Other | Admitting: Adult Health

## 2012-06-05 DIAGNOSIS — F1027 Alcohol dependence with alcohol-induced persisting dementia: Secondary | ICD-10-CM | POA: Insufficient documentation

## 2012-06-05 DIAGNOSIS — F0150 Vascular dementia without behavioral disturbance: Secondary | ICD-10-CM

## 2012-08-29 ENCOUNTER — Non-Acute Institutional Stay (SKILLED_NURSING_FACILITY): Payer: Medicare Other | Admitting: Internal Medicine

## 2012-08-29 DIAGNOSIS — R6889 Other general symptoms and signs: Secondary | ICD-10-CM

## 2012-08-29 DIAGNOSIS — F1027 Alcohol dependence with alcohol-induced persisting dementia: Secondary | ICD-10-CM

## 2012-08-29 DIAGNOSIS — H409 Unspecified glaucoma: Secondary | ICD-10-CM

## 2012-08-29 DIAGNOSIS — I10 Essential (primary) hypertension: Secondary | ICD-10-CM

## 2012-08-29 DIAGNOSIS — R196 Halitosis: Secondary | ICD-10-CM

## 2012-08-29 NOTE — Progress Notes (Signed)
Patient ID: Leonard Gentry, male   DOB: 1945-07-09, 67 y.o.   MRN: [id number] Location:  Location:  [facility] Provider:  Gwenith Spitz. Renato Gails, D.O., C.M.D.  Code Status:  Full code  Chief Complaint  Patient presents with  . Medical Managment of Chronic Issues    HPI: 67 yo male with h/o dementia related to alcohol, alcohol-related vitamin deficiencies, failure to thrive and insomnia was seen for routine mgt of chronic diseases.  His dentition is very poor and says his teeth are not always brushed.  Discussed with nursing staff.    Review of Systems:  Review of Systems  Constitutional: Negative for fever and chills.  HENT: Negative for congestion.        Poor dentition with halitosis  Eyes: Positive for blurred vision.  Respiratory: Negative for shortness of breath.   Cardiovascular: Negative for chest pain.  Gastrointestinal: Negative for abdominal pain.  Genitourinary: Negative for dysuria.  Musculoskeletal: Negative for myalgias.  Skin: Negative for rash.  Neurological: Negative for dizziness.  Psychiatric/Behavioral: Positive for memory loss. The patient has insomnia.     Medications: Patient's Medications  New Prescriptions   No medications on file  Previous Medications   ACETAMINOPHEN (TYLENOL) 325 MG TABLET    Take 650 mg by mouth every 6 (six) hours as needed. For pain   ASPIRIN EC 81 MG TABLET    Take 81 mg by mouth daily.   BIMATOPROST (LUMIGAN) 0.01 % SOLN    Place 1 drop into both eyes at bedtime.   DONEPEZIL (ARICEPT) 10 MG TABLET    Take 10 mg by mouth at bedtime.   FOLIC ACID (FOLVITE) 1 MG TABLET    Take 1 mg by mouth daily.   LISINOPRIL (PRINIVIL,ZESTRIL) 20 MG TABLET    Take 20 mg by mouth daily.   MEMANTINE HCL ER (NAMENDA XR) 28 MG CP24    Take 1 capsule by mouth daily.   MIRTAZAPINE (REMERON) 15 MG TABLET    Take 7.5 mg by mouth at bedtime.   MULTIPLE VITAMIN (MULTIVITAMIN WITH MINERALS) TABS    Take 1 tablet by mouth daily.   OVER THE COUNTER  MEDICATION    Take 240 mLs by mouth 3 (three) times daily. 2 calorie supplement   THIAMINE (VITAMIN B-1) 100 MG TABLET    Take 100 mg by mouth daily.   VITAMIN B-12 (CYANOCOBALAMIN) 1000 MCG TABLET    Take 1,000 mcg by mouth daily.  Modified Medications   No medications on file  Discontinued Medications   No medications on file    Physical Exam: There were no vitals filed for this visit. Physical Exam  Constitutional:  Thin black male, often seen hallucinating   HENT:  Head: Normocephalic and atraumatic.  Large volumes of tartar and food material in teeth especially lower--teeth are very uneven and in poor condition (h/o alcoholism), halitosis present  Eyes: EOM are normal. Pupils are equal, round, and reactive to light.  Cardiovascular: Normal rate, regular rhythm, normal heart sounds and intact distal pulses.   Pulmonary/Chest: Effort normal and breath sounds normal. No respiratory distress.  Abdominal: Soft. Bowel sounds are normal. He exhibits no distension. There is no tenderness.  Musculoskeletal: Normal range of motion. He exhibits no edema and no tenderness.  Neurological: He is alert.  Skin: Skin is warm and dry.   Labs reviewed: 04/09/12:  hgb 11.6, hct 35.3, wbc 6.5, plts 369, Na 139, K 4.8, BUN 24, cr 1.13  Assessment/Plan 1. Halitosis -Rinse  mouth q 6 hours with chloraseptic rinse to help gingivitis (has severe bleeding gums when attempts are made to brush teeth), if this does not work, try peridex rinse  2. Dementia, alcoholic -has visual hallucinations--often seen reaching into space and talking when no one is there -does not sleep well--on remeron for this -also on aricept and namenda -is dependent in ADLs  -needs regular tooth brushing  3. Unspecified glaucoma(365.9) -on several eye drops--followed by [facility]  4. Essential hypertension, benign -at goal, no changes needed  Family/ staff Communication: discussed orders for mouthwash and hygiene with  nursing staff  Goals of care: full code--needs this discussed with family

## 2012-08-31 ENCOUNTER — Encounter: Payer: Self-pay | Admitting: Internal Medicine

## 2012-09-28 ENCOUNTER — Non-Acute Institutional Stay (SKILLED_NURSING_FACILITY): Payer: Medicare Other | Admitting: Nurse Practitioner

## 2012-09-28 ENCOUNTER — Encounter: Payer: Self-pay | Admitting: Nurse Practitioner

## 2012-09-28 DIAGNOSIS — F0390 Unspecified dementia without behavioral disturbance: Secondary | ICD-10-CM

## 2012-09-28 DIAGNOSIS — E785 Hyperlipidemia, unspecified: Secondary | ICD-10-CM

## 2012-09-28 DIAGNOSIS — F1020 Alcohol dependence, uncomplicated: Secondary | ICD-10-CM

## 2012-09-28 DIAGNOSIS — I10 Essential (primary) hypertension: Secondary | ICD-10-CM

## 2012-09-28 NOTE — Progress Notes (Signed)
Patient ID: Leonard Gentry, male   DOB: Dec 28, 1945, 67 y.o.   MRN: [id number]   PCP: Bufford Spikes, DO    No Known Allergies  Chief Complaint  Patient presents with  . Medical Managment of Chronic Issues    HPI:  67 year old male seen today for routine visit. Pt currently without any complaints and staff without any concerns at this time. 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 No complications noted from the medication presently being used.takes lisinopril 20 mg daily asa 81 mg daily      Review of Systems:  Review of Systems  Constitutional: Negative for fever, chills and malaise/fatigue.  HENT: Negative for congestion and sore throat  Respiratory: Negative for cough and shortness of breath.  Cardiovascular: Negative for chest pain, palpitations and leg swelling.  Gastrointestinal: Negative for abdominal pain, diarrhea and constipation.  Genitourinary: Negative for dysuria.  Skin: Negative.   Past Medical History  Diagnosis Date  . Hypertension   . Hyperkalemia   . Dementia    No past surgical history on file. Social History:   reports that he has quit smoking. He does not have any smokeless tobacco history on file. His alcohol and drug histories are not on file.  No family history on file.  Medications: Patient's Medications  New Prescriptions   No medications on file   Previous Medications   ACETAMINOPHEN (TYLENOL) 325 MG TABLET    Take 650 mg by mouth every 6 (six) hours as needed. For pain   ASPIRIN EC 81 MG TABLET    Take 81 mg by mouth daily.   BIMATOPROST (LUMIGAN) 0.01 % SOLN    Place 1 drop into both eyes at bedtime.   BRINZOLAMIDE-BRIMONIDINE (SIMBRINZA) 1-0.2 % SUSP    Place 1 drop into both eyes 2 (two) times daily.   DONEPEZIL (ARICEPT) 10 MG TABLET    Take 10 mg by mouth at bedtime.   FOLIC ACID (FOLVITE) 1 MG TABLET    Take 1 mg by mouth daily.   LISINOPRIL (PRINIVIL,ZESTRIL) 20 MG TABLET    Take 20 mg by mouth daily.   MEMANTINE HCL ER (NAMENDA XR) 28 MG CP24    Take 1 capsule by mouth daily.   MULTIPLE VITAMIN (MULTIVITAMIN WITH MINERALS) TABS    Take 1 tablet by mouth daily.   THIAMINE (VITAMIN B-1) 100 MG TABLET    Take 100 mg by mouth daily.   VITAMIN B-12 (CYANOCOBALAMIN) 1000 MCG TABLET    Take 1,000 mcg by mouth daily.  Modified Medications   No medications on file  Discontinued Medications   No medications on file     Physical Exam:  Filed Vitals:   09/28/12 1621  BP: 128/76  Pulse: 70  Temp: 97.6 F (36.4 C)  Resp: 20   Constitutional: He appears well-developed and well-nourished.  HENT:  Head: Normocephalic and atraumatic.  Right Ear: External ear normal.  Left Ear: External  ear normal.  Nose: Nose normal.  Mouth/Throat: Oropharynx is clear and moist. Bad dentition and foul smelling breath Eyes: EOM are normal. Pupils are equal, round, and reactive to light.  Neck: Normal range of motion. Neck supple.  Cardiovascular: Normal rate, regular rhythm and normal heart sounds.  Pulmonary/Chest: Effort normal and breath sounds normal.  Abdominal: Soft. Bowel sounds are normal.  Musculoskeletal: MAEx 4 Neurological: He is alert.  Skin: Skin is warm and dry.  Psychiatric: He has a normal mood and affect.     Labs reviewed: 4/14 wbc 6.5, rbc 4.3, hgb 11.3, hct 35, plt 369 Sodium 139, potassium 4.8, glucose 69, BUN  24, Cr 1.12  Assessment/Plan 1. Essential hypertension, benign Blood pressures stable; will cont current medications  2. Dementia Stable on aricept and namenda  3. Other and unspecified hyperlipidemia Will get fasting lipids  4. Other and unspecified alcohol dependence, unspecified drinking behavior No behaviors noted; will follow up cbc and cmp

## 2012-11-02 ENCOUNTER — Non-Acute Institutional Stay (SKILLED_NURSING_FACILITY): Payer: Medicare Other | Admitting: Nurse Practitioner

## 2012-11-02 DIAGNOSIS — H409 Unspecified glaucoma: Secondary | ICD-10-CM

## 2012-11-02 DIAGNOSIS — I10 Essential (primary) hypertension: Secondary | ICD-10-CM

## 2012-11-02 DIAGNOSIS — F1020 Alcohol dependence, uncomplicated: Secondary | ICD-10-CM

## 2012-11-02 DIAGNOSIS — F0390 Unspecified dementia without behavioral disturbance: Secondary | ICD-10-CM

## 2012-11-02 NOTE — Progress Notes (Signed)
Patient ID: Leonard Gentry, male   DOB: 01/21/1945, 67 y.o.   MRN: [id number] Nursing Home Location:  [facility]   Place of Service: SNF (31)  PCP: REED, TIFFANY, DO  No Known Allergies  Chief Complaint  Patient presents with  . Medical Managment of Chronic Issues    HPI:  67 year old male seen today for routine visit. Pt currently without any complaints and staff without any concerns at this time.  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 No complications noted from the medication presently being used.takes lisinopril 20 mg daily asa 81 mg daily    Review of Systems:  Review of Systems  Constitutional: Negative for fever and chills.  HENT: Negative for congestion.   Respiratory: Negative for shortness of breath.   Cardiovascular: Negative for chest pain.  Gastrointestinal: Negative for abdominal pain and constipation.  Genitourinary: Negative for dysuria.  Musculoskeletal: Negative for myalgias.  Skin: Negative for rash.  Neurological: Negative for dizziness.  Psychiatric/Behavioral: Positive for memory loss.     Past Medical History  Diagnosis Date  . Hypertension   . Hyperkalemia   . Dementia    No past surgical history on file. Social History:   reports that he has quit smoking. He does not have any smokeless tobacco history on file. His alcohol and drug  histories are not on file.  No family history on file.  Medications: Patient's Medications  New Prescriptions   No medications on file  Previous Medications   ACETAMINOPHEN (TYLENOL) 325 MG TABLET    Take 650 mg by mouth every 6 (six) hours as needed. For pain   ASPIRIN EC 81 MG TABLET    Take 81 mg by mouth daily.   BIMATOPROST (LUMIGAN) 0.01 % SOLN    Place 1 drop into both eyes at bedtime.   BRINZOLAMIDE-BRIMONIDINE (SIMBRINZA) 1-0.2 % SUSP    Place 1 drop into both eyes 2 (two) times daily.   DONEPEZIL (ARICEPT) 10 MG TABLET    Take 10 mg by mouth at bedtime.   FOLIC ACID (FOLVITE) 1 MG TABLET    Take 1 mg by mouth daily.   LISINOPRIL (PRINIVIL,ZESTRIL) 20 MG TABLET    Take 20 mg by mouth daily.   MEMANTINE HCL ER (NAMENDA XR) 28 MG CP24    Take 1 capsule by mouth daily.   MULTIPLE VITAMIN (MULTIVITAMIN WITH MINERALS) TABS    Take 1 tablet by mouth daily.   THIAMINE (VITAMIN B-1) 100 MG TABLET    Take 100 mg by mouth daily.   VITAMIN B-12 (CYANOCOBALAMIN) 1000 MCG TABLET    Take 1,000 mcg by mouth daily.  Modified Medications   No medications on file  Discontinued Medications   No medications on file     Physical Exam:  Filed Vitals:   11/02/12 1344  BP: 132/78  Pulse: 80  Temp: 98.8 F (37.1 C)  Resp: 16  Physical Exam  Constitutional: He is well-developed, well-nourished, and in no distress. No distress.  HENT:  Head: Normocephalic and atraumatic.  Mouth/Throat: Oropharynx is clear and moist. No oropharyngeal exudate.  Poor dentition with halitosis  Neck: Normal range of motion. Neck supple. No thyromegaly present.  Cardiovascular: Normal rate, regular rhythm and normal heart sounds.   Pulmonary/Chest: Effort normal and breath sounds normal. No respiratory distress.  Abdominal: Soft. Bowel sounds are normal. He exhibits no distension.  Musculoskeletal: Normal range of motion. He exhibits no edema and no tenderness.  Lymphadenopathy:    He has no cervical  adenopathy.  Neurological: He is alert.  Skin: Skin is warm and dry. He is not diaphoretic. No erythema.     Labs reviewed: 4/14 wbc 6.5, rbc 4.3, hgb 11.3, hct 35, plt 369  Sodium 139, potassium 4.8, glucose 69, BUN 24, Cr 1.12  Assessment/Plan 1. Unspecified glaucoma(365.9) -conts current medications  2. Dementia -stable on current medications  3. Essential hypertension, benign Patient is stable; continue current regimen. Will monitor and make changes as necessary.  4. Other and unspecified alcohol dependence, unspecified drinking behavior Currently on MVI, thiamine and folic acid  Labs/tests ordered Reorder cbc, cmp and fasing lipids

## 2012-11-13 NOTE — Progress Notes (Signed)
Patient ID: Leonard Gentry, male   DOB: 05-21-1945, 67 y.o.   MRN: [id number]  [facility]  No Known Allergies  Chief Complaint  Patient presents with  . Medical Managment of Chronic Issues    HPI He is being seen for the management of his chronic illnesses. Overall his status remains without change over the recent past. There are no concerns being voiced by the nursing staff at this time. He is not voicing any concerns.   Past Medical History  Diagnosis Date  . Hypertension   . Hyperkalemia   . Dementia     No past surgical history on file.  Filed Vitals:   06/05/12 1150  BP: 126/74  Pulse: 72  Height: 6' (1.829 m)  Weight: 161 lb (73.029 kg)    MEDICATIONS  aricept 10 mg daily Asa 81 mg daily Folic acid 1 mg daily Lisinopril 20 mg daily lumigan to both eyes nightly mvi daily namenda xr 28 mg daily Thiamine 100 mg daily Tylenol 650 mg every 6 hours Vit b12 1000 mcg daily  LABS REVIEWED:  04-09-12: wbc 6.5; hgb 11.6; hct 35.3; mcv 81.5; plt 369; glucose 69; bun 24; creat 1.12; k+4.8; na++139   Review of Systems:  Review of Systems  Constitutional: Negative for fever, chills and malaise/fatigue.  HENT: Negative for congestion and sore throat.   Respiratory: Negative for cough and shortness of breath.   Cardiovascular: Negative for chest pain, palpitations and leg swelling.  Gastrointestinal: Negative for abdominal pain, diarrhea and constipation.  Genitourinary: Negative for dysuria.  Skin: Negative.     Physical Exam: Physical Exam  Constitutional: He appears well-developed and well-nourished. .  Mouth/Throat: Oropharynx is clear and moist.  Eyes: EOM are normal. Pupils are equal, round, and reactive to light.  Neck: Normal range of motion. Neck supple.  Cardiovascular: Normal rate, regular rhythm and normal heart sounds.   Pulmonary/Chest: Effort normal and breath sounds normal.  Abdominal: Soft. Bowel sounds are normal.  Musculoskeletal:  Currently in  wheelchair  Neurological: He is alert.  Skin: Skin is warm and dry.  Psychiatric: He has a normal mood and affect.    ASSESSMENT/PLAN  1. Hypertension: he is stable will continue lisinopril 20 mg daily and will monitor  2. Glaucoma: will continue lumigan to both eyes nightly  3. Dementia: without change in status; will continue his aricept 10 mg daily and namenda xr 28 mg daily and will monitor

## 2012-12-10 ENCOUNTER — Non-Acute Institutional Stay (SKILLED_NURSING_FACILITY): Payer: Medicare Other | Admitting: Nurse Practitioner

## 2012-12-10 DIAGNOSIS — I10 Essential (primary) hypertension: Secondary | ICD-10-CM

## 2012-12-10 DIAGNOSIS — K08409 Partial loss of teeth, unspecified cause, unspecified class: Secondary | ICD-10-CM

## 2012-12-10 DIAGNOSIS — F0150 Vascular dementia without behavioral disturbance: Secondary | ICD-10-CM

## 2012-12-10 DIAGNOSIS — K08109 Complete loss of teeth, unspecified cause, unspecified class: Secondary | ICD-10-CM

## 2012-12-10 DIAGNOSIS — F0151 Vascular dementia with behavioral disturbance: Secondary | ICD-10-CM

## 2012-12-10 NOTE — Progress Notes (Signed)
Patient ID: Leonard Gentry, male   DOB: 03-29-1945, 67 y.o.   MRN: [id number]    Nursing Home Location:  [facility] [place]   Place of Service: SNF (31)  PCP: REED, TIFFANY, DO  No Known Allergies  Chief Complaint  Patient presents with  . Medical Managment of Chronic Issues    HPI:  67 year old male seen today for routine visit. Pt currently without any complaints and staff reports increase in behaviors Pt has had all his teeth exacted due to poor dentition   DEMENTIA IN CONDITIONS CLASSIFIED ELSEWHERE WITHOU increase in behaviors note din the last few weeks; pt seeing ppl that are not there and acts like he is shooting a gun; takes Aricept 10 mg daily Namenda xr 28 mg daily   ALCOHOLISM, CHORNIC The patient is not drinking alcohol.No complications noted from the medication presently being used- history of, he is taking thiamine and folic acid daily   GLAUCOMA The glaucoma symptoms are stable.No complications noted from the medication presently being used.takes lumigan 0.01% both eyes at hs   HTN UNSPECIFIED The blood pressure readings have been in the target range. No complications noted from the medication presently being used.takes lisinopril 20 mg daily asa 81 mg daily    Review of Systems:  Unable to provide due to dementia  Past Medical History  Diagnosis Date  . Hypertension   . Hyperkalemia   . Dementia    No past surgical history on file. Social History:   reports that he has quit smoking. He does not have any smokeless tobacco history on file. His alcohol and drug histories are not on file.  No family history on file.  Medications: Patient's Medications  New Prescriptions   No medications on file  Previous Medications   ACETAMINOPHEN (TYLENOL) 325 MG TABLET    Take 650 mg by mouth every 6 (six) hours as needed. For pain   ASPIRIN EC 81 MG TABLET    Take 81 mg by mouth daily.   BIMATOPROST (LUMIGAN) 0.01 % SOLN    Place 1 drop into both eyes at  bedtime.   BRINZOLAMIDE-BRIMONIDINE (SIMBRINZA) 1-0.2 % SUSP    Place 1 drop into both eyes 2 (two) times daily.   DONEPEZIL (ARICEPT) 10 MG TABLET    Take 10 mg by mouth at bedtime.   FOLIC ACID (FOLVITE) 1 MG TABLET    Take 1 mg by mouth daily.   LISINOPRIL (PRINIVIL,ZESTRIL) 20 MG TABLET    Take 20 mg by mouth daily.   MEMANTINE HCL ER (NAMENDA XR) 28 MG CP24    Take 1 capsule by mouth daily.   MULTIPLE VITAMIN (MULTIVITAMIN WITH MINERALS) TABS    Take 1 tablet by mouth daily.   THIAMINE (VITAMIN B-1) 100 MG TABLET    Take 100 mg by mouth daily.   VITAMIN B-12 (CYANOCOBALAMIN) 1000 MCG TABLET    Take 1,000 mcg by mouth daily.  Modified Medications   No medications on file  Discontinued Medications   No medications on file     Physical Exam: Physical Exam  Constitutional: He is well-developed, well-nourished, and in no distress. No distress.  HENT:  Head: Normocephalic and atraumatic.  Mouth/Throat: Oropharynx is clear and moist. No oropharyngeal exudate.  Minimal Swelling to lower gums with healing incision   Neck: Normal range of motion. Neck supple. No thyromegaly present.  Cardiovascular: Normal rate, regular rhythm and normal heart sounds.   Pulmonary/Chest: Effort normal and breath sounds normal. No  respiratory distress.  Abdominal: Soft. Bowel sounds are normal. He exhibits no distension.  Musculoskeletal: Normal range of motion. He exhibits no edema and no tenderness.  Lymphadenopathy:    He has no cervical adenopathy.  Neurological: He is alert.  Skin: Skin is warm and dry. He is not diaphoretic. No erythema.  Psychiatric: His affect is inappropriate. He exhibits disordered thought content, abnormal new learning ability, abnormal recent memory and abnormal remote memory.    Filed Vitals:   12/10/12 1325  BP: 120/58  Pulse: 70  Temp: 99 F (37.2 C)  Resp: 20      Assessment/Plan 1. Essential hypertension, benign -stable at this time.   2. Vascular dementia  with behavioral disturbance -worse, will consult psych services   3. Loss of teeth due to extraction, unspecified edentulism -still not eating at baseline since surgery; will add scheduled tylenol TID -conts supplements  Labs Not drawn again; will reorder cbc and cmp

## 2012-12-16 IMAGING — CT CT HEAD W/O CM
3 of 4 series · 14 of 30 positions shown, 16 images · non-contrast
Comparison: CT of the head performed 04/15/2009, and MRI/MRA of the
brain performed 04/16/2009

CT HEAD

CLINICAL DATA: Unwitnessed fall; found with head on floor.
Concern for head or cervical spine injury.

CT HEAD WITHOUT CONTRAST AND CT CERVICAL SPINE WITHOUT CONTRAST
TECHNIQUE: Multidetector CT imaging of the head and cervical spine
was performed following the standard protocol without intravenous
contrast.  Multiplanar CT image reconstructions of the cervical
spine were also generated.

[Series 3: head w/o · axial · non-contrast · 0.43mm/px · z∈[+1114,+1164]mm · 2 of 31 slices shown]
[im 11/31  brain]
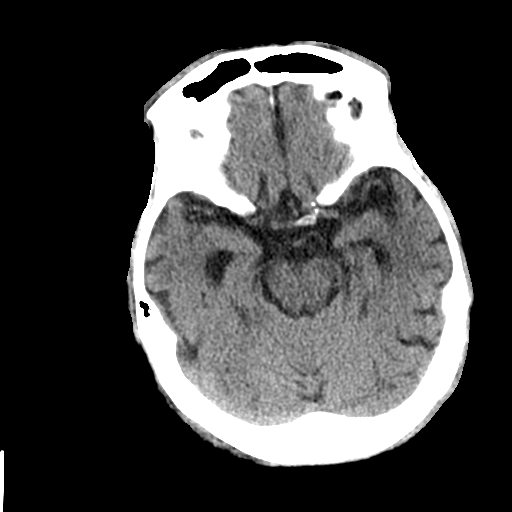
[im 21/31  brain]
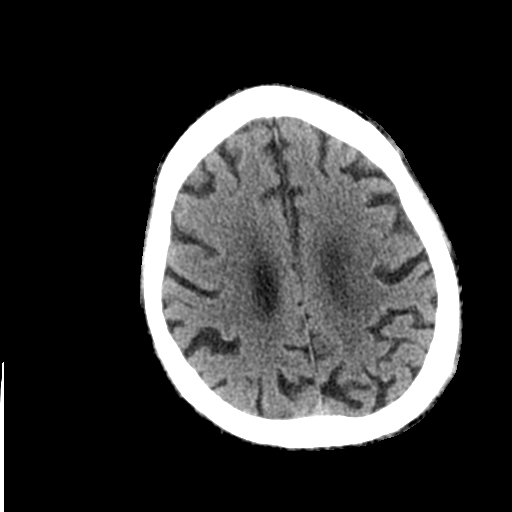

[Series 4: bone windows · axial · 0.43mm/px · z∈[+1094,+1184]mm · 4 of 51 slices shown]
[im 11/51  bone]
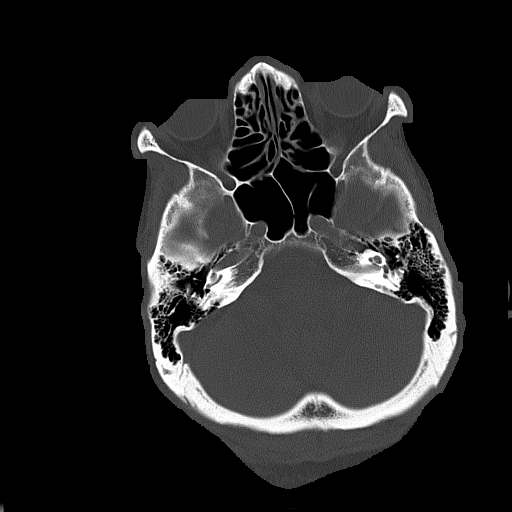
[im 21/51  bone]
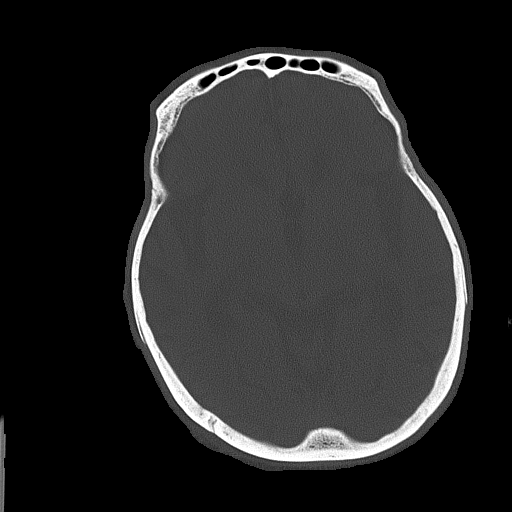
[im 31/51  bone]
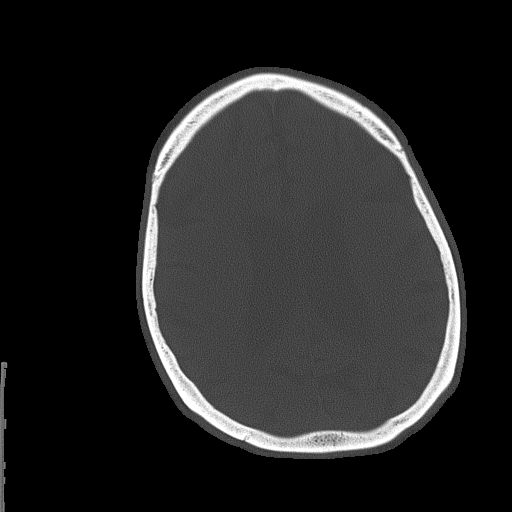
[im 41/51  bone]
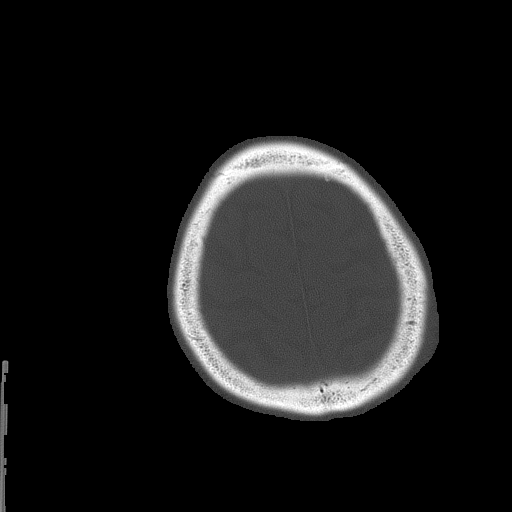

[Series 9: axial recon · axial · 0.18mm/px · z∈[+929,+1045]mm · 8 of 84 slices shown, 10 images]
[im 10/84  brain]
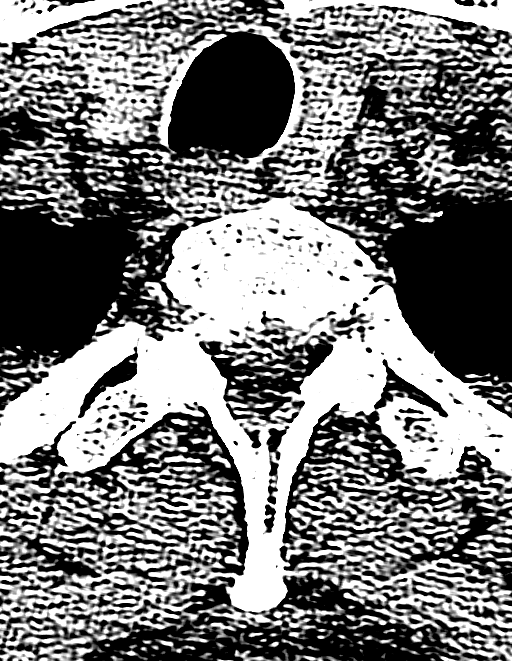
[im 10/84  bone]
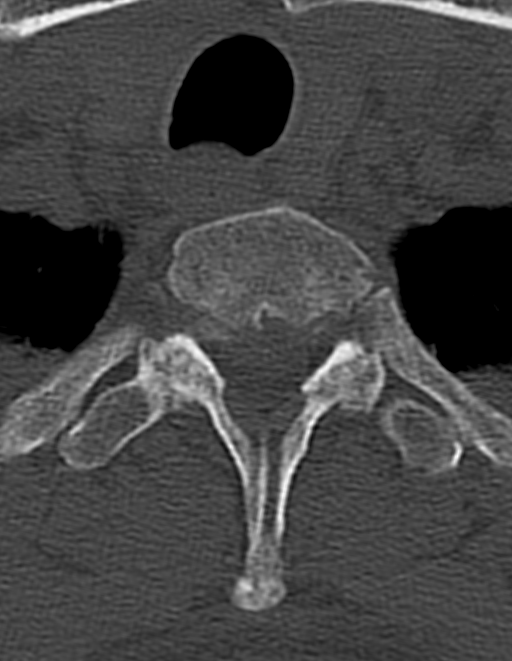
[im 19/84  brain]
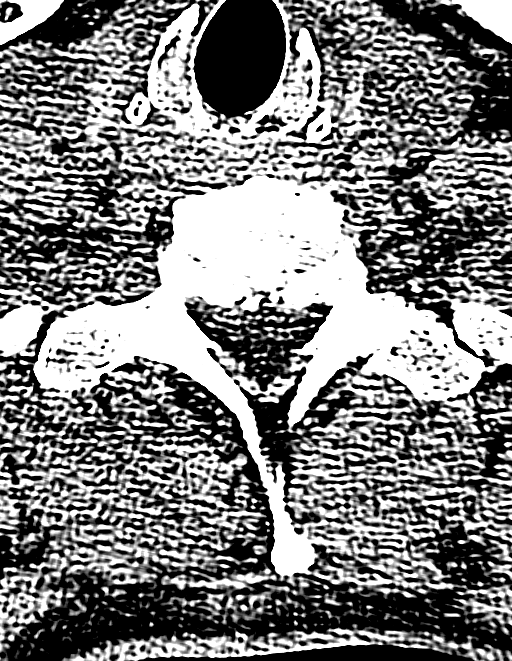
[im 28/84  brain]
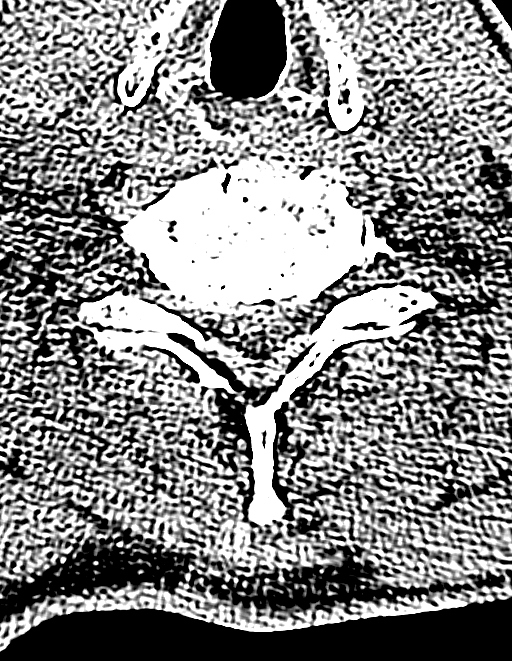
[im 37/84  brain]
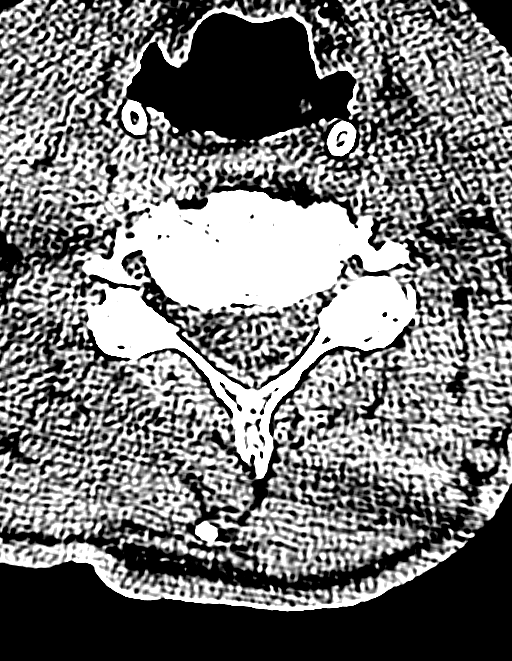
[im 47/84  brain]
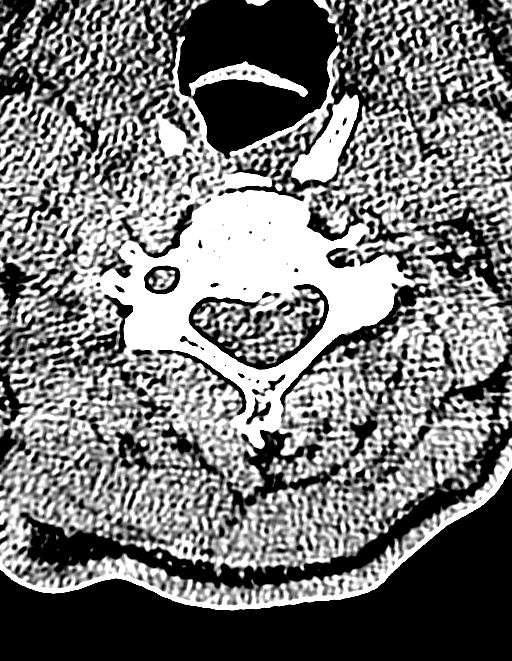
[im 47/84  bone]
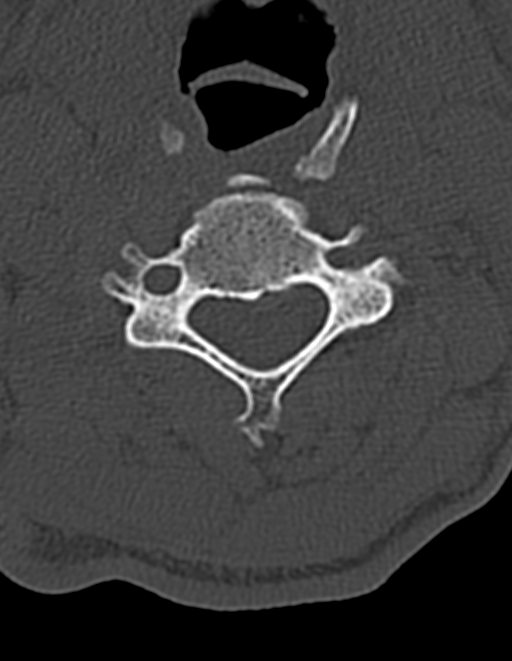
[im 56/84  brain]
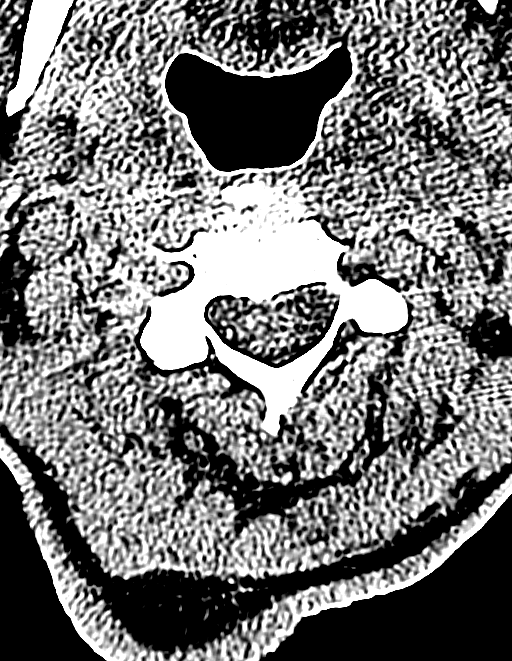
[im 65/84  brain]
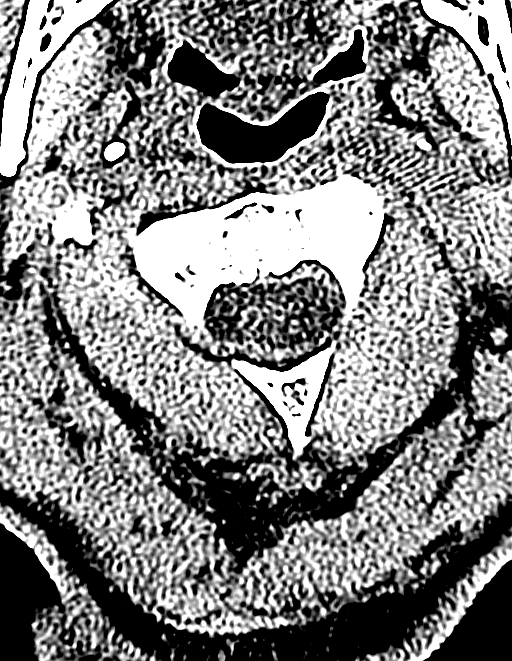
[im 74/84  brain]
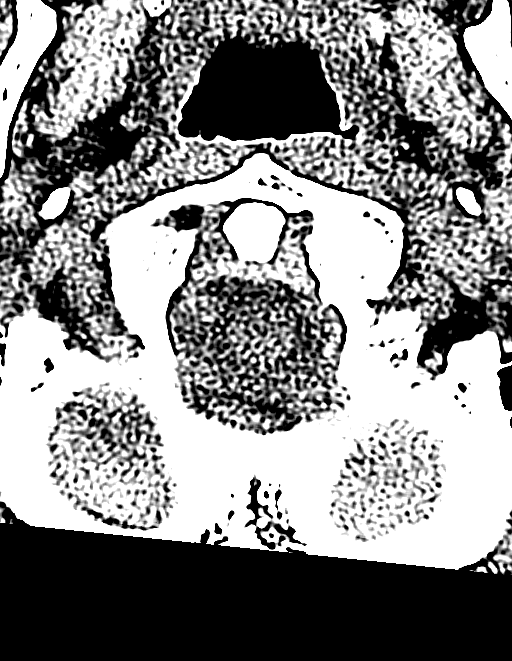

[14 of 30 positions shown; findings below may reference images not displayed]

FINDINGS: There is no evidence of acute infarction, mass lesion, or
intra- or extra-axial hemorrhage on CT.

Prominence of the ventricles and sulci reflects moderate cortical
volume loss.  Periventricular and subcortical white matter change
likely reflects small vessel ischemic microangiopathy.  Mild
cerebellar atrophy is noted.  A small chronic infarct is noted in
the inferior left frontal lobe.

The brainstem and fourth ventricle are within normal limits.  The
basal ganglia are unremarkable in appearance.  No mass effect or
midline shift is seen.

There is no evidence of fracture; visualized osseous structures are
unremarkable in appearance.  The orbits are within normal limits.
The paranasal sinuses and mastoid air cells are well-aerated.  A
soft tissue laceration is noted overlying the right frontal
calvarium.  A large amount of cerumen is noted within the right
external auditory canal.
IMPRESSION: 1.  No evidence of traumatic intracranial injury or fracture.
2.  Moderate cortical volume loss and scattered small vessel
ischemic microangiopathy; small chronic infarct noted within the
inferior left frontal lobe.
3.  Soft tissue laceration overlying the right frontal calvarium.
4.  Large amount of cerumen noted within the right external
auditory canal.

CT CERVICAL SPINE
FINDINGS: There is no evidence of fracture or subluxation.
Vertebral bodies demonstrate normal height and alignment.
Multilevel disc space narrowing is noted along the cervical spine,
with associated anterior and posterior disc osteophyte complexes.
Prevertebral soft tissues are within normal limits.

The thyroid gland is unremarkable in appearance.  The visualized
lung apices are clear.  No significant soft tissue abnormalities
are seen.
IMPRESSION: 1.  No evidence of fracture or subluxation along the cervical
spine.
2.  Degenerative change noted along the cervical spine.

## 2012-12-18 ENCOUNTER — Non-Acute Institutional Stay (SKILLED_NURSING_FACILITY): Payer: Medicare Other | Admitting: Internal Medicine

## 2012-12-18 ENCOUNTER — Encounter: Payer: Self-pay | Admitting: Internal Medicine

## 2012-12-18 DIAGNOSIS — R404 Transient alteration of awareness: Secondary | ICD-10-CM

## 2012-12-18 DIAGNOSIS — F10951 Alcohol use, unspecified with alcohol-induced psychotic disorder with hallucinations: Secondary | ICD-10-CM

## 2012-12-18 DIAGNOSIS — K08409 Partial loss of teeth, unspecified cause, unspecified class: Secondary | ICD-10-CM

## 2012-12-18 DIAGNOSIS — R410 Disorientation, unspecified: Secondary | ICD-10-CM

## 2012-12-18 DIAGNOSIS — K08109 Complete loss of teeth, unspecified cause, unspecified class: Secondary | ICD-10-CM

## 2012-12-18 DIAGNOSIS — F1027 Alcohol dependence with alcohol-induced persisting dementia: Secondary | ICD-10-CM

## 2012-12-18 NOTE — Progress Notes (Signed)
Patient ID: Leonard Gentry, male   DOB: Jan 25, 1945, 67 y.o.   MRN: [id number]  Location:  [facility] Provider:  Gwenith Spitz. Renato Gails, D.O., C.M.D.  Code Status:  Full code, ward of the state   Chief Complaint  Patient presents with  . Acute Visit    increased confusion and hallucinations    HPI:  67 yo black male long term care resident with alcoholic dementia seen due to increased lethargy and more hallucinations than usual.  Review of Systems:  Review of Systems  Unable to perform ROS: dementia    Medications: Patient's Medications  New Prescriptions   No medications on file  Previous Medications   ACETAMINOPHEN (TYLENOL) 325 MG TABLET    Take 650 mg by mouth every 6 (six) hours as needed. For pain   ASPIRIN EC 81 MG TABLET    Take 81 mg by mouth daily.   BIMATOPROST (LUMIGAN) 0.01 % SOLN    Place 1 drop into both eyes at bedtime.   BRINZOLAMIDE-BRIMONIDINE (SIMBRINZA) 1-0.2 % SUSP    Place 1 drop into both eyes 2 (two) times daily.   DONEPEZIL (ARICEPT) 10 MG TABLET    Take 10 mg by mouth at bedtime.   FOLIC ACID (FOLVITE) 1 MG TABLET    Take 1 mg by mouth daily.   LISINOPRIL (PRINIVIL,ZESTRIL) 20 MG TABLET    Take 20 mg by mouth daily.   MEMANTINE HCL ER (NAMENDA XR) 28 MG CP24    Take 1 capsule by mouth daily.   MULTIPLE VITAMIN (MULTIVITAMIN WITH MINERALS) TABS    Take 1 tablet by mouth daily.   THIAMINE (VITAMIN B-1) 100 MG TABLET    Take 100 mg by mouth daily.   VITAMIN B-12 (CYANOCOBALAMIN) 1000 MCG TABLET    Take 1,000 mcg by mouth daily.  Modified Medications   No medications on file  Discontinued Medications   No medications on file    Physical Exam: Filed Vitals:   12/18/12 1623  BP: 137/81  Pulse: 70  Temp: 97.2 F (36.2 C)  Resp: 20   Physical Exam  Constitutional:  Frail black male, resting in bed, unable to make eye contact today  HENT:  Had several teeth extracted today by dentist  Cardiovascular: Normal rate, regular rhythm, normal  heart sounds and intact distal pulses.   Pulmonary/Chest: Effort normal and breath sounds normal. No respiratory distress.  Abdominal: Soft. Bowel sounds are normal. He exhibits no distension. There is no tenderness.  Musculoskeletal: Normal range of motion. He exhibits no tenderness.  Neurological:  Having active visual hallucinations, very distracted and unable to focus during visit (normally able to do that)  Skin: Skin is warm and dry.  Scaly skin on his face     Labs reviewed: 12/11/12:  Glucose 75, BUN 25, cr 1, Na 135, K 4.4; wbc 7.6, h/h 13.4/42.2, plts 268  Assessment/Plan 1. Dementia, alcoholic -longstanding, was previously homeless alcoholic prior to admission here -has poor dentition as a result -does have baseline with visual hallucinations, but can normally answer appropriately and focus on visit, look at examiner  2. Loss of teeth due to extraction, unspecified edentulism -gradually having teeth removed due to terrible dentition -now started on amoxicillin and florastor by dentist  3. Alcoholic neurologic psychosis, with hallucinations -seems significantly worse -will do delirium workup b/c of this  4. Acute delirium cbc, cmp, ammonia stat Push po fluids for 72 hrs--if not taking, contact md/np for ivf orders Vs q shift wa  x 72 hrs  Labs/tests ordered:  Cbc, cmp, ammonia stat

## 2013-04-24 ENCOUNTER — Non-Acute Institutional Stay (SKILLED_NURSING_FACILITY): Payer: Medicare Other | Admitting: Internal Medicine

## 2013-04-24 ENCOUNTER — Encounter: Payer: Self-pay | Admitting: Internal Medicine

## 2013-04-24 DIAGNOSIS — K08109 Complete loss of teeth, unspecified cause, unspecified class: Secondary | ICD-10-CM

## 2013-04-24 DIAGNOSIS — I10 Essential (primary) hypertension: Secondary | ICD-10-CM

## 2013-04-24 DIAGNOSIS — F10951 Alcohol use, unspecified with alcohol-induced psychotic disorder with hallucinations: Secondary | ICD-10-CM

## 2013-04-24 DIAGNOSIS — F1027 Alcohol dependence with alcohol-induced persisting dementia: Secondary | ICD-10-CM

## 2013-04-24 DIAGNOSIS — K08409 Partial loss of teeth, unspecified cause, unspecified class: Secondary | ICD-10-CM

## 2013-04-24 NOTE — Progress Notes (Signed)
Patient ID: Leonard Gentry, male   DOB: Jun 14, 1945, 68 y.o.   MRN: [id number]  Location:[facility] Provider:  Meryle Pugmire L. Renato Gailseed, D.O., C.M.D.  Code Status:  DNR, hospice  Chief Complaint  Patient presents with  . Medical Management of Chronic Issues    hospice pt with dementia related to his alcohol abuse    HPI:  Frail black male long term care resident on hospice for alcohol dementia Review of Systems:  Review of Systems  Constitutional: Negative for fever.  Respiratory: Negative for shortness of breath.   Cardiovascular: Negative for chest pain.  Gastrointestinal: Negative for abdominal pain.  Genitourinary: Negative for dysuria.  Musculoskeletal: Negative for falls.  Neurological: Negative for dizziness.  Psychiatric/Behavioral: Positive for hallucinations and memory loss.    Medications: Patient's Medications  New Prescriptions   No medications on file  Previous Medications   ACETAMINOPHEN (TYLENOL) 325 MG TABLET    Take 650 mg by mouth every 6 (six) hours as needed. For pain   BIMATOPROST (LUMIGAN) 0.01 % SOLN    Place 1 drop into both eyes at bedtime.   BRIMONIDINE-TIMOLOL (COMBIGAN) 0.2-0.5 % OPHTHALMIC SOLUTION    Place 1 drop into both eyes every 12 (twelve) hours.   DONEPEZIL (ARICEPT) 10 MG TABLET    Take 10 mg by mouth at bedtime.   FOLIC ACID (FOLVITE) 1 MG TABLET    Take 1 mg by mouth daily.   LISINOPRIL (PRINIVIL,ZESTRIL) 20 MG TABLET    Take 20 mg by mouth daily.   MEMANTINE HCL ER (NAMENDA XR) 28 MG CP24    Take 1 capsule by mouth daily.   THIAMINE (VITAMIN B-1) 100 MG TABLET    Take 100 mg by mouth daily.   VITAMIN B-12 (CYANOCOBALAMIN) 1000 MCG TABLET    Take 1,000 mcg by mouth daily.  Modified Medications   No medications on file  Discontinued Medications   ASPIRIN EC 81 MG TABLET    Take 81 mg by mouth daily.   BRINZOLAMIDE-BRIMONIDINE (SIMBRINZA) 1-0.2 % SUSP    Place 1 drop into both eyes 2 (two) times daily.   MULTIPLE VITAMIN  (MULTIVITAMIN WITH MINERALS) TABS    Take 1 tablet by mouth daily.    Physical Exam: Filed Vitals:   04/24/13 1138  BP: 153/82  Pulse: 78  Temp: 98.9 F (37.2 C)  Resp: 18  Height: 6' (1.829 m)  Weight: 143 lb (64.864 kg)  Physical Exam  Constitutional:  Frail black male  HENT:  edentulous  Cardiovascular: Normal rate, regular rhythm and normal heart sounds.   Pulmonary/Chest: Effort normal and breath sounds normal.  Abdominal: Soft. Bowel sounds are normal. He exhibits no distension and no mass. There is no tenderness.  Neurological: He is alert.  Having active visual hallucinations  Skin: Skin is warm and dry.     Labs reviewed: Basic Metabolic Panel: No results found for this basename: NA, K, CL, CO2, GLUCOSE, BUN, CREATININE, CALCIUM, MG, PHOS,  in the last 8760 hours  Liver Function Tests: No results found for this basename: AST, ALT, ALKPHOS, BILITOT, PROT, ALBUMIN,  in the last 8760 hours    Assessment/Plan  1. Dementia, alcoholic -end stage, is on hospice care, this mostly happened after his teeth were pulled due to severe halitosis and inability to keep them clean  2. Loss of teeth due to extraction, unspecified edentulism -affected his intake  3. Alcoholic neurologic psychosis, with hallucinations -has visual hallucinations--always looks like he is fishing  4. Essential  hypertension, benign -on lisinopril for his bp

## 2013-07-09 ENCOUNTER — Non-Acute Institutional Stay (SKILLED_NURSING_FACILITY): Payer: Medicare Other | Admitting: Internal Medicine

## 2013-07-09 DIAGNOSIS — F01518 Vascular dementia, unspecified severity, with other behavioral disturbance: Secondary | ICD-10-CM

## 2013-07-09 DIAGNOSIS — I10 Essential (primary) hypertension: Secondary | ICD-10-CM

## 2013-07-09 DIAGNOSIS — H409 Unspecified glaucoma: Secondary | ICD-10-CM

## 2013-07-09 DIAGNOSIS — F1020 Alcohol dependence, uncomplicated: Secondary | ICD-10-CM

## 2013-07-09 DIAGNOSIS — F0151 Vascular dementia with behavioral disturbance: Secondary | ICD-10-CM

## 2013-07-09 DIAGNOSIS — F0150 Vascular dementia without behavioral disturbance: Secondary | ICD-10-CM

## 2013-07-09 NOTE — Progress Notes (Signed)
MRN: [id number] Name: Leonard Gentry  Sex: male Age: 68 y.o. DOB: 1945-04-10  [place] #: Karren Burly Facility/Room: 215A Level Of Care: SNF Provider: Inocencio Homes D Emergency Contacts: Extended Emergency Contact Information Primary Emergency Contact: Moore,Henry          [place] Phone: [phone] Relation: None  Code Status: FULL  Allergies: Review of patient's allergies indicates no known allergies.  Chief Complaint  Patient presents with  . Medical Management of Chronic Issues    HPI: Patient is 68 y.o. male who is a resident who is being seen for routine problems. Pt has been doing very well.  Past Medical History  Diagnosis Date  . Hypertension   . Hyperkalemia   . Dementia     History reviewed. No pertinent past surgical history.    Medication List       This list is accurate as of: 07/09/13 11:59 PM.  Always use your most recent med list.               acetaminophen 325 MG tablet  Commonly known as:  TYLENOL  Take 650 mg by mouth every 6 (six) hours as needed. For pain     bimatoprost 0.01 % Soln  Commonly known as:  LUMIGAN  Place 1 drop into both eyes at bedtime.     COMBIGAN 0.2-0.5 % ophthalmic solution  Generic drug:  brimonidine-timolol  Place 1 drop into both eyes every 12 (twelve) hours.     donepezil 10 MG tablet  Commonly known as:  ARICEPT  Take 10 mg by mouth at bedtime.     folic acid 1 MG tablet  Commonly known as:  FOLVITE  Take 1 mg by mouth daily.     lisinopril 20 MG tablet  Commonly known as:  PRINIVIL,ZESTRIL  Take 20 mg by mouth daily.     NAMENDA XR 28 MG Cp24  Generic drug:  Memantine HCl ER  Take 1 capsule by mouth daily.     thiamine 100 MG tablet  Commonly known as:  VITAMIN B-1  Take 100 mg by mouth daily.     vitamin B-12 1000 MCG tablet  Commonly known as:  CYANOCOBALAMIN  Take 1,000 mcg by mouth daily.        No orders of the defined types were placed in this encounter.     Immunization History  Administered Date(s) Administered  . Influenza Whole 10/15/2012  . Pneumococcal-Unspecified 01/24/2011    History  Substance Use Topics  . Smoking status: Former Research scientist (life sciences)  . Smokeless tobacco: Not on file  . Alcohol Use: Not on file    Review of Systems  DATA OBTAINED: from patient; no c/o, does have dementia GENERAL: Feels well no fevers, fatigue, appetite changes SKIN: No itching, rash HEENT: No complaint RESPIRATORY: No cough, wheezing, SOB CARDIAC: No chest pain, palpitations, lower extremity edema  GI: No abdominal pain, No N/V/D or constipation, No heartburn or reflux  GU: No dysuria, frequency or urgency, or incontinence  MUSCULOSKELETAL: No unrelieved bone/joint pain NEUROLOGIC: No headache, dizziness  PSYCHIATRIC: No overt anxiety or sadness. Sleeps well.   Filed Vitals:   07/09/13 2318  BP: 112/74  Pulse: 70  Temp: 97.7 F (36.5 C)  Resp: 18    Physical Exam  GENERAL APPEARANCE: Alert, conversant. Appropriately groomed. No acute distress  SKIN: No diaphoresis rash HEENT: Unremarkable RESPIRATORY: Breathing is even, unlabored. Lung sounds are clear   CARDIOVASCULAR: Heart RRR no murmurs, rubs or gallops. No  peripheral edema  GASTROINTESTINAL: Abdomen is soft, non-tender, not distended w/ normal bowel sounds.  GENITOURINARY: Bladder non tender, not distended  MUSCULOSKELETAL: LUE contracture NEUROLOGIC: Cranial nerves 2-12 grossly intact; some movement of LUE PSYCHIATRIC: Mood and affect appropriate to situation, no behavioral issues  Patient Active Problem List   Diagnosis Date Noted  . Vascular dementia with behavioral disturbance 06/05/2012  . Unspecified glaucoma(365.9) 04/23/2012  . Essential hypertension, benign 04/23/2012  . Other and unspecified alcohol dependence, unspecified drinking behavior 04/23/2012    CBC    Component Value Date/Time   WBC 7.2 04/21/2009 0620   RBC 5.30 04/21/2009 0620   RBC 4.72 04/16/2009 0645    HGB 15.3 04/21/2009 0620   HCT 45.7 04/21/2009 0620   PLT 295 04/21/2009 0620   MCV 86.2 04/21/2009 0620   LYMPHSABS 1.1 04/16/2009 0645   MONOABS 0.6 04/16/2009 0645   EOSABS 0.3 04/16/2009 0645   BASOSABS 0.1 04/16/2009 0645    CMP     Component Value Date/Time   NA 136 04/30/2009 0525   K 5.6* 04/30/2009 0525   CL 103 04/30/2009 0525   CO2 27 04/30/2009 0525   GLUCOSE 79 04/30/2009 0525   BUN 35* 04/30/2009 0525   CREATININE 1.38 04/30/2009 0525   CALCIUM 9.2 04/30/2009 0525   PROT 6.4 04/16/2009 0645   ALBUMIN 3.5 04/16/2009 0645   AST 28 04/16/2009 0645   ALT 14 04/16/2009 0645   ALKPHOS 50 04/16/2009 0645   BILITOT 0.8 04/16/2009 0645   GFRNONAA 52* 04/30/2009 0525   GFRAA  Value: >60        The eGFR has been calculated using the MDRD equation. This calculation has not been validated in all clinical situations. eGFR's persistently <60 mL/min signify possible Chronic Kidney Disease. 04/30/2009 0525    Assessment and Plan  Essential hypertension, benign Controlled on lisinopril 20 mg  Vascular dementia with behavioral disturbance Continue namenda and aricept;pt is doing well  Other and unspecified alcohol dependence, unspecified drinking behavior Not currently using;is on folic acid and thiamine  Unspecified glaucoma(365.9) Continue drops per ophthalmology    Hennie Duos, MD

## 2013-07-13 ENCOUNTER — Encounter: Payer: Self-pay | Admitting: Internal Medicine

## 2013-07-13 NOTE — Assessment & Plan Note (Signed)
Controlled on lisinopril 20 mg

## 2013-07-13 NOTE — Assessment & Plan Note (Signed)
Not currently using;is on folic acid and thiamine

## 2013-07-13 NOTE — Assessment & Plan Note (Signed)
Continue namenda and aricept;pt is doing well

## 2013-07-13 NOTE — Assessment & Plan Note (Signed)
Continue drops per ophthalmology

## 2013-10-23 ENCOUNTER — Encounter: Payer: Self-pay | Admitting: Internal Medicine

## 2013-10-23 ENCOUNTER — Non-Acute Institutional Stay (SKILLED_NURSING_FACILITY): Payer: Medicare Other | Admitting: Internal Medicine

## 2013-10-23 DIAGNOSIS — F10951 Alcohol use, unspecified with alcohol-induced psychotic disorder with hallucinations: Secondary | ICD-10-CM

## 2013-10-23 DIAGNOSIS — I10 Essential (primary) hypertension: Secondary | ICD-10-CM

## 2013-10-23 DIAGNOSIS — F1027 Alcohol dependence with alcohol-induced persisting dementia: Secondary | ICD-10-CM

## 2013-10-23 NOTE — Progress Notes (Signed)
Patient ID: Leonard Gentry, male   DOB: [birth year], [age] y.o.   MRN: [id number]  [facility] Provider:  Gwenith Spitziffany L. Renato Gailseed, D.O., C.M.D.  Code Status:  DNR  Chief Complaint  Patient presents with  . Medical Management of Chronic Issues    pt on hospice care again for his dementia due to continued weight loss    HPI:  68 yo black male here for long  Term care is back on hospice due to continued weight loss.  Staff have no other new concerns about him.  Review of Systems:  Review of Systems  Unable to perform ROS: dementia    Medications: Patient's Medications  New Prescriptions   No medications on file  Previous Medications   ACETAMINOPHEN (TYLENOL) 325 MG TABLET    Take 650 mg by mouth every 6 (six) hours as needed. For pain   BIMATOPROST (LUMIGAN) 0.01 % SOLN    Place 1 drop into both eyes at bedtime.   BRIMONIDINE-TIMOLOL (COMBIGAN) 0.2-0.5 % OPHTHALMIC SOLUTION    Place 1 drop into both eyes every 12 (twelve) hours.   LISINOPRIL (PRINIVIL,ZESTRIL) 20 MG TABLET    Take 20 mg by mouth daily.  Modified Medications   No medications on file  Discontinued Medications   DONEPEZIL (ARICEPT) 10 MG TABLET    Take 10 mg by mouth at bedtime.   FOLIC ACID (FOLVITE) 1 MG TABLET    Take 1 mg by mouth daily.   MEMANTINE HCL ER (NAMENDA XR) 28 MG CP24    Take 1 capsule by mouth daily.   THIAMINE (VITAMIN B-1) 100 MG TABLET    Take 100 mg by mouth daily.   VITAMIN B-12 (CYANOCOBALAMIN) 1000 MCG TABLET    Take 1,000 mcg by mouth daily.    Physical Exam: Filed Vitals:   10/23/13 1216  BP: 129/80  Pulse: 89  Temp: 98.2 F (36.8 C)  Resp: 20  Height: 6' (1.829 m)  Weight: 139 lb (63.05 kg)  SpO2: 96%  Physical Exam  Constitutional:  Frail black male resting in bed  HENT:  edentulous  Cardiovascular: Normal rate, regular rhythm, normal heart sounds and intact distal pulses.   Pulmonary/Chest: Effort normal and breath sounds normal. No respiratory distress.    Abdominal: Soft. Bowel sounds are normal. He exhibits no distension and no mass. There is no tenderness.  Musculoskeletal: Normal range of motion.  Neurological: He is alert.  Speech is blabbering, no evidence of his visual hallucinations or "fishing" today  Skin: Skin is dry.   Labs reviewed: Checking labs is not part of his goals of care on hospice  Assessment/Plan 1. Dementia, alcoholic Stop namenda, folic acid, thiamine, b12 due to goals of care being hospice--also asked social services to contact his guardian to request a change in code status to DNR--pt would not be likely to survive a code and it is not in his best interests at this time to receive CPR   2. Alcoholic neurologic psychosis, with hallucinations -no symptoms during appt today  3. Essential hypertension, benign -bp at goal with lisinopril

## 2013-12-05 ENCOUNTER — Non-Acute Institutional Stay (SKILLED_NURSING_FACILITY): Payer: Medicare Other | Admitting: Adult Health

## 2013-12-05 DIAGNOSIS — I10 Essential (primary) hypertension: Secondary | ICD-10-CM

## 2013-12-05 DIAGNOSIS — F1027 Alcohol dependence with alcohol-induced persisting dementia: Secondary | ICD-10-CM

## 2013-12-05 DIAGNOSIS — H409 Unspecified glaucoma: Secondary | ICD-10-CM

## 2013-12-09 ENCOUNTER — Encounter: Payer: Self-pay | Admitting: Adult Health

## 2013-12-09 NOTE — Progress Notes (Signed)
Patient ID: Leonard Gentry, male   DOB: 12-21-1945, [age] y.o.   MRN: [id number]  [facility]     No Known Allergies     Chief Complaint  Patient presents with  . Medical Management of Chronic Issues    HPI:  He is a long term resident of this facility being seen for the management of his chronic illnesses. He is followed by hospice care. He is unable to participate in the hpi or ros. There are no nursing concerns being voiced at this time.    Past Medical History  Diagnosis Date  . Hypertension   . Hyperkalemia   . Dementia     No past surgical history on file.  VITAL SIGNS BP 136/94 mmHg  Pulse 74  Ht 6' (1.829 m)  Wt 138 lb (62.596 kg)  BMI 18.71 kg/m2  SpO2 94%   Outpatient Encounter Prescriptions as of 12/05/2013  Medication Sig  . acetaminophen (TYLENOL) 325 MG tablet Take 650 mg by mouth every 6 (six) hours as needed. For pain  . bimatoprost (LUMIGAN) 0.01 % SOLN Place 1 drop into both eyes at bedtime.  . brimonidine-timolol (COMBIGAN) 0.2-0.5 % ophthalmic solution Place 1 drop into both eyes every 12 (twelve) hours.  Marland Kitchen. lisinopril (PRINIVIL,ZESTRIL) 20 MG tablet Take 20 mg by mouth daily.     SIGNIFICANT DIAGNOSTIC EXAMS  LABS REVIEWED:   12-11-12: wbc 7.6; hgb 13.4; hct 42.2; mcv 86.;3 plt 268; glucose 75; bun 25; creat 1.0 ;k+4.4; na++135; liver normal albumin 3.4     Review of Systems  Unable to perform ROS    Physical Exam  Constitutional: No distress.  frail  Neck: Neck supple. No JVD present. No thyromegaly present.  Cardiovascular: Normal rate, regular rhythm and intact distal pulses.   Respiratory: Effort normal and breath sounds normal. No respiratory distress. He has no wheezes.  GI: Soft. Bowel sounds are normal. He exhibits no distension.  Musculoskeletal: He exhibits no edema.  Left upper extremity contracture; left hand contracture   Neurological: He is alert.  Skin: Skin is warm and dry. He is not diaphoretic.       ASSESSMENT/  PLAN:  1. Hypertension: will continue lisinopril 20 mg daily will monitor   2. 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     Synthia Innocenteborah Green NP [facility]  Contact [phone] Monday through Friday 8am- 5pm  After hours call [phone]

## 2014-01-02 ENCOUNTER — Non-Acute Institutional Stay (SKILLED_NURSING_FACILITY): Payer: Medicare Other | Admitting: Internal Medicine

## 2014-01-02 DIAGNOSIS — I10 Essential (primary) hypertension: Secondary | ICD-10-CM

## 2014-01-02 DIAGNOSIS — F1027 Alcohol dependence with alcohol-induced persisting dementia: Secondary | ICD-10-CM

## 2014-01-10 ENCOUNTER — Non-Acute Institutional Stay (SKILLED_NURSING_FACILITY): Payer: Medicare Other | Admitting: Adult Health

## 2014-01-10 ENCOUNTER — Encounter: Payer: Self-pay | Admitting: Internal Medicine

## 2014-01-10 DIAGNOSIS — I10 Essential (primary) hypertension: Secondary | ICD-10-CM

## 2014-01-10 DIAGNOSIS — R627 Adult failure to thrive: Secondary | ICD-10-CM

## 2014-01-10 DIAGNOSIS — F1027 Alcohol dependence with alcohol-induced persisting dementia: Secondary | ICD-10-CM

## 2014-01-10 DIAGNOSIS — H409 Unspecified glaucoma: Secondary | ICD-10-CM

## 2014-01-10 NOTE — Progress Notes (Signed)
Patient ID: Leonard Gentry, male   DOB: 1945/04/10, 69 y.o.   MRN: [id number]    [facility]    Place of Service: SNF (31)   No Known Allergies  Chief Complaint  Patient presents with  . Dementia    HPI:   69 yo male seen today for dementia. He has a hx dementia due to alcoholism and has progressively worsened. [facility]. His condition is terminal. His code status was discussed with Dr Jed Limerickross  who has discussed with his Guardian. He is currently Full code but it is felt that if he were to undergo CPR that the outcome would cause more harm than good. Pt is nonverbal and unable to provide any hx due to his dementia.  Medications: Patient's Medications  New Prescriptions   No medications on file  Previous Medications   ACETAMINOPHEN (TYLENOL) 325 MG TABLET    Take 650 mg by mouth every 6 (six) hours as needed. For pain   BIMATOPROST (LUMIGAN) 0.01 % SOLN    Place 1 drop into both eyes at bedtime.   BRIMONIDINE-TIMOLOL (COMBIGAN) 0.2-0.5 % OPHTHALMIC SOLUTION    Place 1 drop into both eyes every 12 (twelve) hours.   LISINOPRIL (PRINIVIL,ZESTRIL) 20 MG TABLET    Take 20 mg by mouth daily.  Modified Medications   No medications on file  Discontinued Medications   No medications on file     Review of Systems   Unable to obtain due to pt's poor mental status.   Filed Vitals:   01/10/14 1334  BP: 136/94  Pulse: 74  Temp: 97.8 F (36.6 C)  Weight: 137 lb (62.143 kg)  SpO2: 94%   Body mass index is 18.58 kg/(m^2).  Physical Exam   CONSTITUTIONAL: Looks frail  in NAD. Awake and alert. Nonverbal. He does not follow simple commands  HEENT: PERRLA. No scleral icterus.  NECK: Supple. Nontender. No palpable cervical or supraclavicular lymph nodes. No carotid bruit b/l. CVS: Regular rate without murmur, gallop or rub. LUNGS: anteriorly CTA b/l no wheezing, rales or rhonchi. ABDOMEN: Bowel sounds present x 4. Soft, nontender,  nondistended. No palpable mass or bruit EXTREMITIES: No edema b/l. Distal pulses palpable. No calf tenderness PSYCH: he is nonverbal and does not follow commands.  Labs reviewed: No visits with results within 3 Month(s) from this visit. Latest known visit with results is:  Admission on 04/15/2009, Discharged on 04/30/2009  No results displayed because visit has over 200 results.     Chart reviewed  Assessment/Plan      ICD-9-CM ICD-10-CM   1. Dementia, alcoholic 291.2 F10.27   2. Essential hypertension, benign 401.1 I10    - After careful review of his chart and my examination, I agree that code status needs to be changed to DNR. He is steadily declining in health. CPR will cause more harm than good and can potentially lead to a vegetative state. Appropriate forms to be completed to change code status from Full to DNR   [facility] HospitalMonica S. Ancil Linseyarter, D. O., F. A. C. O. I.  [facility] [address] [place], [place] [address] [phone] Office (Wednesdays and Fridays 8 AM - 5 PM) [phone] Cell (Monday-Friday 8 AM - 5 PM)

## 2014-01-31 ENCOUNTER — Encounter: Payer: Self-pay | Admitting: Adult Health

## 2014-01-31 DIAGNOSIS — R627 Adult failure to thrive: Secondary | ICD-10-CM | POA: Insufficient documentation

## 2014-01-31 NOTE — Progress Notes (Signed)
Patient ID: Leonard Gentry, male   DOB: November 10, 1945, 69 y.o.   MRN: [id number]  [facility]     No Known Allergies     Chief Complaint  Patient presents with  . Medical Management of Chronic Issues    HPI:  He is a long term resident of this facility being seen for the management of his chronic illnesses. He is followed by hospice care. He is unable to participate in the hpi or ros. His code status change is pending at this time. There are no concerns being voiced by the nursing staff at this time.     Past Medical History  Diagnosis Date  . Hypertension   . Hyperkalemia   . Dementia     No past surgical history on file.  VITAL SIGNS BP 98/66 mmHg  Pulse 88  Ht 6' (1.829 m)  Wt 134 lb (60.782 kg)  BMI 18.17 kg/m2  SpO2 94%   Outpatient Encounter Prescriptions as of 01/10/2014  Medication Sig  . acetaminophen (TYLENOL) 325 MG tablet Take 650 mg by mouth every 6 (six) hours as needed. For pain  . bimatoprost (LUMIGAN) 0.01 % SOLN Place 1 drop into both eyes at bedtime.  . brimonidine-timolol (COMBIGAN) 0.2-0.5 % ophthalmic solution Place 1 drop into both eyes every 12 (twelve) hours.  Marland Kitchen. lisinopril (PRINIVIL,ZESTRIL) 20 MG tablet Take 20 mg by mouth daily.     SIGNIFICANT DIAGNOSTIC EXAMS   LABS REVIEWED:   12-11-12: wbc 7.6; hgb 13.4; hct 42.2; mcv 86.;3 plt 268; glucose 75; bun 25; creat 1.0 ;k+4.4; na++135; liver normal albumin 3.4       Review of Systems  Unable to perform ROS    Physical Exam Constitutional: No distress.  frail  Neck: Neck supple. No JVD present. No thyromegaly present.  Cardiovascular: Normal rate, regular rhythm and intact distal pulses.   Respiratory: Effort normal and breath sounds normal. No respiratory distress. He has no wheezes.  GI: Soft. Bowel sounds are normal. He exhibits no distension.  Musculoskeletal: He exhibits no edema.  Left upper extremity contracture; left hand contracture   Neurological: He is alert.  Skin: Skin  is warm and dry. He is not diaphoretic.     ASSESSMENT/ PLAN:  1. Hypertension: will stop the lisinopril at this time and will monitor his status.    2. 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   4. FTT: his current weight is 134 pounds; is on supplements per facility protocol. Will not make changes will monitor his status.     Leonard Innocenteborah Shelene Krage NP [facility]  Contact [phone] Monday through Friday 8am- 5pm  After hours call [phone]

## 2014-02-13 ENCOUNTER — Non-Acute Institutional Stay (SKILLED_NURSING_FACILITY): Payer: Medicare Other | Admitting: Adult Health

## 2014-02-13 DIAGNOSIS — F1027 Alcohol dependence with alcohol-induced persisting dementia: Secondary | ICD-10-CM | POA: Diagnosis not present

## 2014-02-13 DIAGNOSIS — H409 Unspecified glaucoma: Secondary | ICD-10-CM

## 2014-02-13 DIAGNOSIS — I10 Essential (primary) hypertension: Secondary | ICD-10-CM | POA: Diagnosis not present

## 2014-02-13 DIAGNOSIS — R627 Adult failure to thrive: Secondary | ICD-10-CM | POA: Diagnosis not present

## 2014-03-12 ENCOUNTER — Non-Acute Institutional Stay (SKILLED_NURSING_FACILITY): Payer: Medicare Other | Admitting: Adult Health

## 2014-03-12 DIAGNOSIS — I10 Essential (primary) hypertension: Secondary | ICD-10-CM | POA: Diagnosis not present

## 2014-03-12 DIAGNOSIS — R627 Adult failure to thrive: Secondary | ICD-10-CM | POA: Diagnosis not present

## 2014-03-12 DIAGNOSIS — F1027 Alcohol dependence with alcohol-induced persisting dementia: Secondary | ICD-10-CM

## 2014-03-12 DIAGNOSIS — H409 Unspecified glaucoma: Secondary | ICD-10-CM | POA: Diagnosis not present

## 2014-03-29 NOTE — Progress Notes (Signed)
Patient ID: Leonard Gentry, male   DOB: 04-09-1945, 69 y.o.   MRN: [id number]  [facility]     No Known Allergies     Chief Complaint  Patient presents with  . Medical Management of Chronic Issues    HPI:  He is a long term resident of this facility being seen for the management of his chronic illnesses. He is followed by hospice care. He is unable to participate in the hpi or ros. there are no nursing concerns at this time.    Past Medical History  Diagnosis Date  . Hypertension   . Hyperkalemia   . Dementia     No past surgical history on file.  VITAL SIGNS BP 120/63 mmHg  Pulse 74  Ht 6' (1.829 m)  Wt 132 lb (59.875 kg)  BMI 17.90 kg/m2   Outpatient Encounter Prescriptions as of 02/13/2014  Medication Sig  . acetaminophen (TYLENOL) 325 MG tablet Take 650 mg by mouth every 6 (six) hours as needed. For pain  . bimatoprost (LUMIGAN) 0.01 % SOLN Place 1 drop into both eyes at bedtime.  . brimonidine-timolol (COMBIGAN) 0.2-0.5 % ophthalmic solution Place 1 drop into both eyes every 12 (twelve) hours.     SIGNIFICANT DIAGNOSTIC EXAMS   LABS REVIEWED:   12-11-12: wbc 7.6; hgb 13.4; hct 42.2; mcv 86.;3 plt 268; glucose 75; bun 25; creat 1.0 ;k+4.4; na++135; liver normal albumin 3.4     ROS Unable to perform ROS   Physical Exam Constitutional: No distress.  frail  Neck: Neck supple. No JVD present. No thyromegaly present.  Cardiovascular: Normal rate, regular rhythm and intact distal pulses.   Respiratory: Effort normal and breath sounds normal. No respiratory distress. He has no wheezes.  GI: Soft. Bowel sounds are normal. He exhibits no distension.  Musculoskeletal: He exhibits no edema.  Left upper extremity contracture; left hand contracture   Neurological: He is alert.  Skin: Skin is warm and dry. He is not diaphoretic.     ASSESSMENT/ PLAN:   1. Hypertension: is presently not on medications; will monitor   2. Glaucoma: will continue combigan to  both eyes twice daily and lumigan to both eyes nightly   3. Alcoholic dementia: is endstage; is presently not on medications; is followed by hospice care; the focus of his care is comfort only; will not make changes will monitor his status.   4. FTT: his last month weight was 134 pounds; current weight is 132 pounds  is on supplements per facility protocol. Weight loss is expected at this stage of his disease state. Will not make changes will monitor his status.     Synthia Innocenteborah Eudell Mcphee NP [facility]  Contact [phone] Monday through Friday 8am- 5pm  After hours call [phone]

## 2014-04-18 ENCOUNTER — Encounter: Payer: Self-pay | Admitting: Adult Health

## 2014-05-04 NOTE — Progress Notes (Signed)
Patient ID: Leonard Gentry, male   DOB: 1945/01/20, 69 y.o.   MRN: [id number]  [facility]     No Known Allergies     Chief Complaint  Patient presents with  . Medical Management of Chronic Issues    HPI:  He is a long term resident of this facility being seen for the management of his chronic illnesses. He is followed by hospice care. He continues to slowly decline with weight loss present. He is end stage dementia; weight loss is expected at this stage of his disease state. He is unable to participate in the hpi or ros. There are no nursing concerns at this time.    Past Medical History  Diagnosis Date  . Hypertension   . Hyperkalemia   . Dementia     No past surgical history on file.  VITAL SIGNS BP 126/71 mmHg  Pulse 67  Ht 5\' 10"  (1.778 m)  Wt 130 lb (58.968 kg)  BMI 18.65 kg/m2  SpO2 97%   Outpatient Encounter Prescriptions as of 03/12/2014  Medication Sig  . acetaminophen (TYLENOL) 325 MG tablet Take 650 mg by mouth every 6 (six) hours as needed. For pain  . bimatoprost (LUMIGAN) 0.01 % SOLN Place 1 drop into both eyes at bedtime.  . brimonidine-timolol (COMBIGAN) 0.2-0.5 % ophthalmic solution Place 1 drop into both eyes every 12 (twelve) hours.     SIGNIFICANT DIAGNOSTIC EXAMS   LABS REVIEWED:   12-11-12: wbc 7.6; hgb 13.4; hct 42.2; mcv 86.;3 plt 268; glucose 75; bun 25; creat 1.0 ;k+4.4; na++135; liver normal albumin 3.4  02-05-14: pre-albumin 19 02-20-14: tsh 1.42; albumin 4.1     ROS Unable to perform ROS    Physical Exam Constitutional: No distress.  frail  Neck: Neck supple. No JVD present. No thyromegaly present.  Cardiovascular: Normal rate, regular rhythm and intact distal pulses.   Respiratory: Effort normal and breath sounds normal. No respiratory distress. He has no wheezes.  GI: Soft. Bowel sounds are normal. He exhibits no distension.  Musculoskeletal: He exhibits no edema.  Left upper extremity contracture; left hand contracture     Neurological: He is alert.  Skin: Skin is warm and dry. He is not diaphoretic.      ASSESSMENT/ PLAN:   1. Hypertension: is presently not on medications; will monitor   2. 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   4. FTT: his last month weight was 134 pounds; current weight is 130 pounds  is on supplements per facility protocol. He has consistent slow weight loss which is   expected at this stage of his disease state. Will not make changes will monitor his status.     Synthia Innocenteborah Green NP [facility]  Contact [phone] Monday through Friday 8am- 5pm  After hours call [phone]

## 2014-05-04 DEATH — deceased
